# Patient Record
Sex: Female | Born: 1985 | Race: White | Hispanic: Yes | Marital: Married | Smoking: Current some day smoker
Health system: Southern US, Community
[De-identification: ages and names within clinical notes are randomized; demographics above are authoritative.]

## PROBLEM LIST (undated history)

## (undated) HISTORY — PX: CHOLECYSTECTOMY: SHX55

## (undated) HISTORY — PX: TUBAL LIGATION: SHX77

---
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
## 2005-10-09 ENCOUNTER — Emergency Department (HOSPITAL_COMMUNITY): Admission: EM | Admit: 2005-10-09 | Discharge: 2005-10-10 | Payer: Self-pay | Admitting: Emergency Medicine

## 2005-10-14 ENCOUNTER — Ambulatory Visit (HOSPITAL_COMMUNITY): Admission: RE | Admit: 2005-10-14 | Discharge: 2005-10-14 | Payer: Self-pay | Admitting: Obstetrics and Gynecology

## 2005-10-15 ENCOUNTER — Emergency Department (HOSPITAL_COMMUNITY): Admission: EM | Admit: 2005-10-15 | Discharge: 2005-10-15 | Payer: Self-pay | Admitting: Emergency Medicine

## 2005-10-20 ENCOUNTER — Encounter (INDEPENDENT_AMBULATORY_CARE_PROVIDER_SITE_OTHER): Payer: Self-pay | Admitting: General Surgery

## 2005-10-20 ENCOUNTER — Observation Stay (HOSPITAL_COMMUNITY): Admission: RE | Admit: 2005-10-20 | Discharge: 2005-10-22 | Payer: Self-pay | Admitting: General Surgery

## 2006-03-09 ENCOUNTER — Emergency Department (HOSPITAL_COMMUNITY): Admission: EM | Admit: 2006-03-09 | Discharge: 2006-03-09 | Payer: Self-pay | Admitting: Emergency Medicine

## 2007-07-05 IMAGING — US US TRANSVAGINAL NON-OB
1 series · 14 of 25 positions shown · non-contrast
Comparison: Obstetrical ultrasound 01/09/2004.

CLINICAL DATA: Prolonged menstrual period, lasting approximately 3 weeks.

TRANSVAGINAL PELVIC ULTRASOUND  10/14/2005:
TECHNIQUE: Transvaginal ultrasound examination of the pelvis was performed
including evaluation of the uterus, ovaries, adnexal regions, and pelvic
cul-de-sac.

[Series 1: unknown · 0.15mm/px · 14 of 37 slices shown]
[im 1/37]
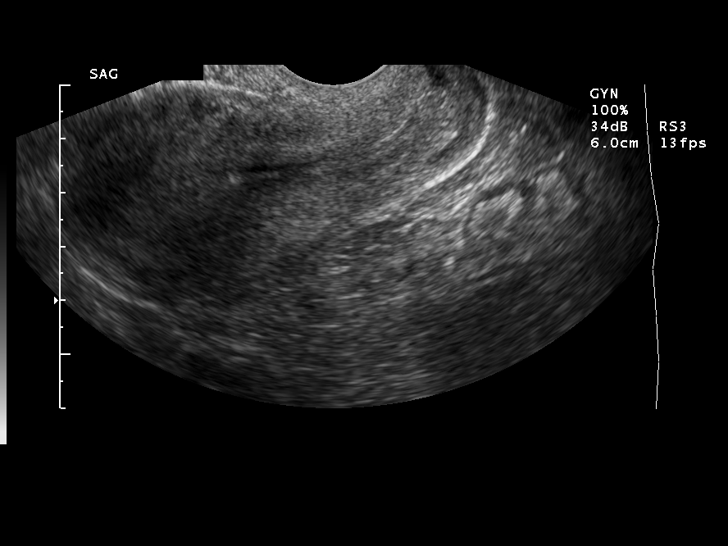
[im 4/37]
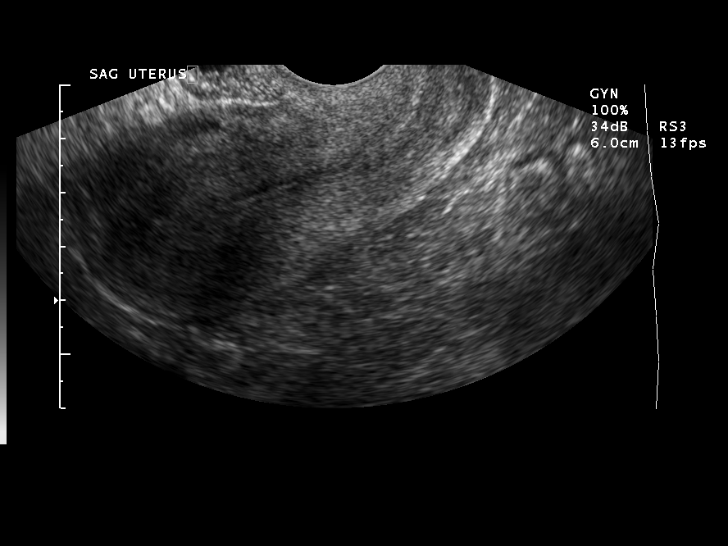
[im 7/37]
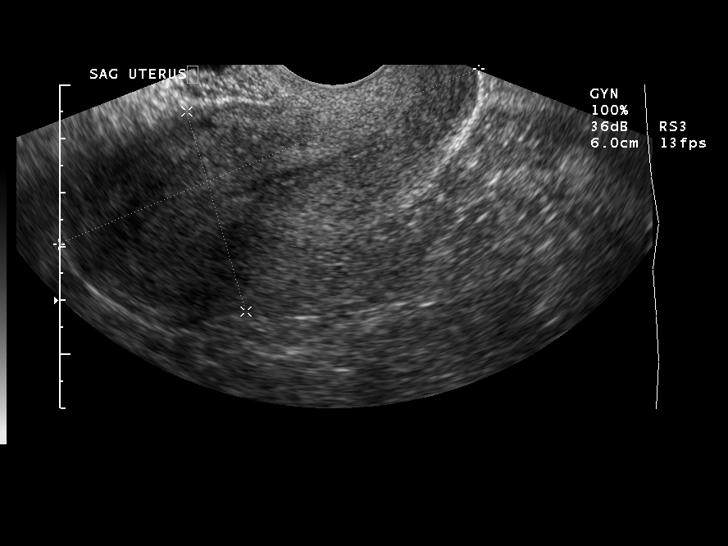
[im 10/37]
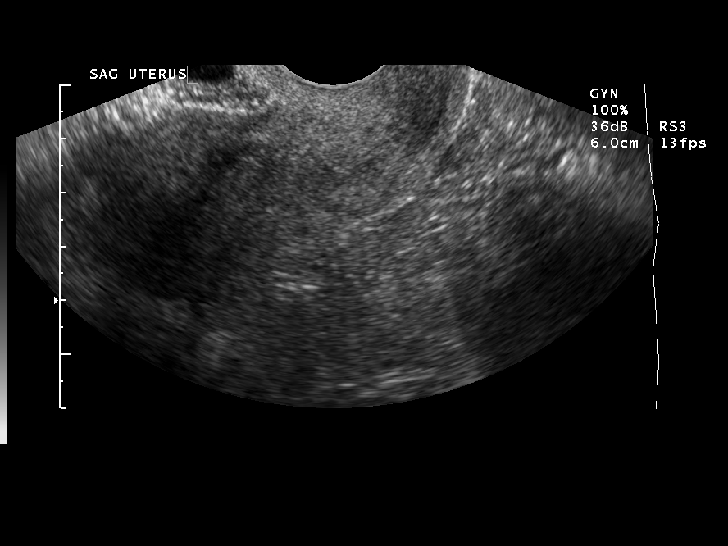
[im 13/37]
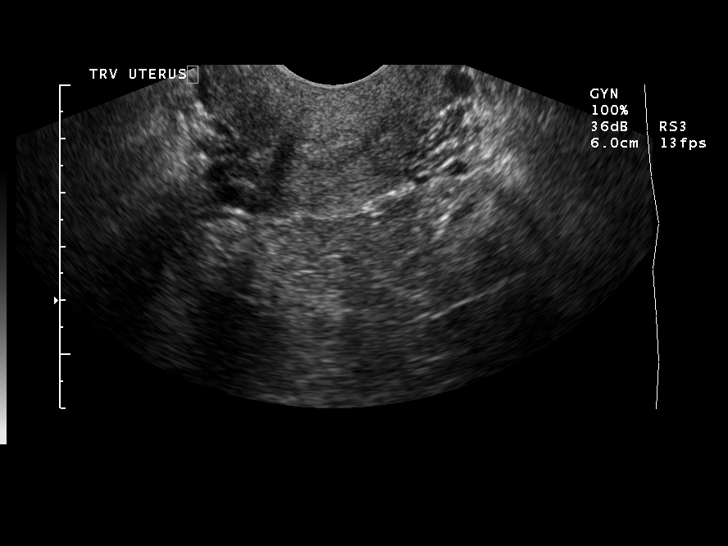
[im 14/37]
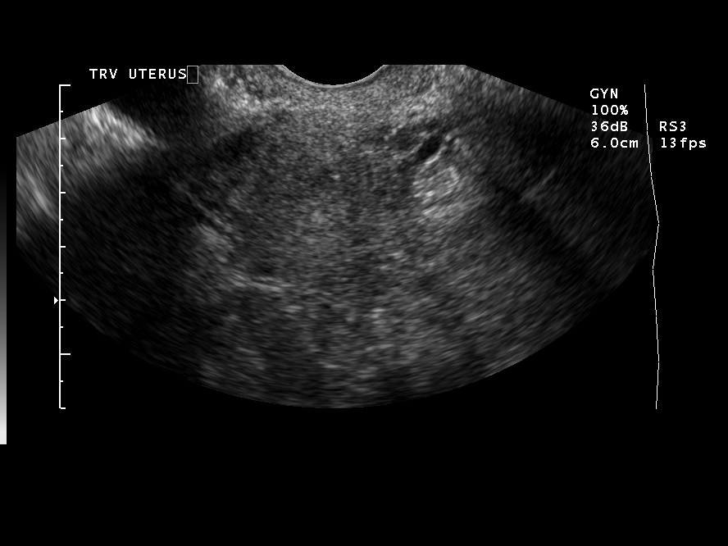
[im 17/37]
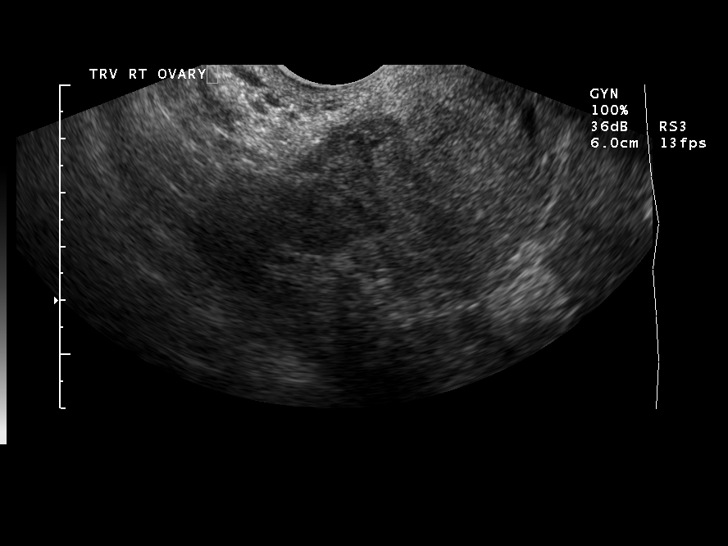
[im 20/37]
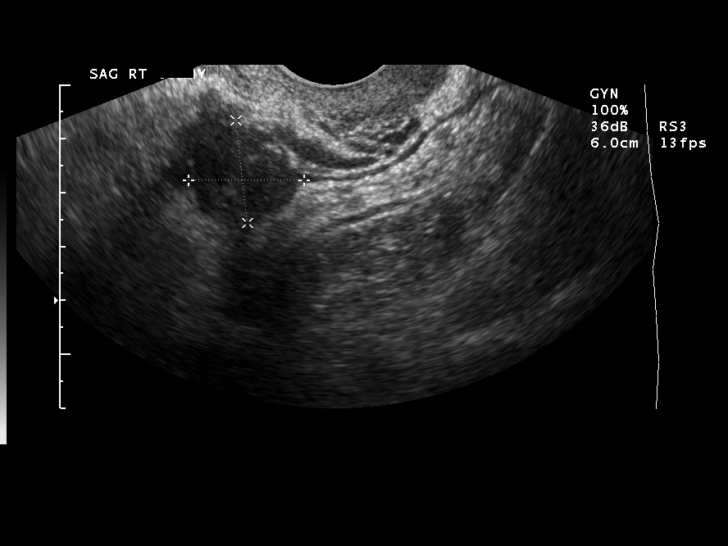
[im 23/37]
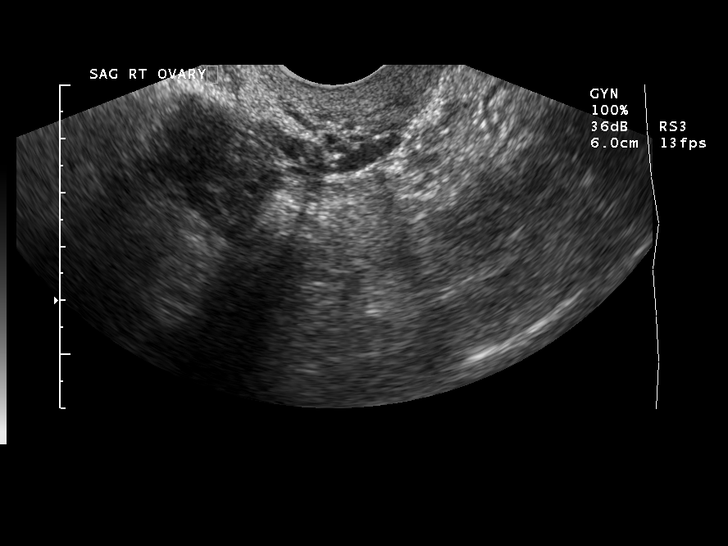
[im 25/37]
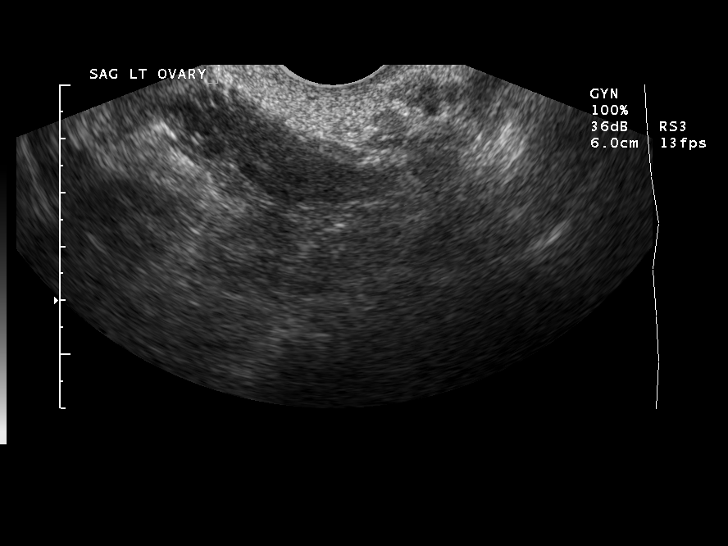
[im 28/37]
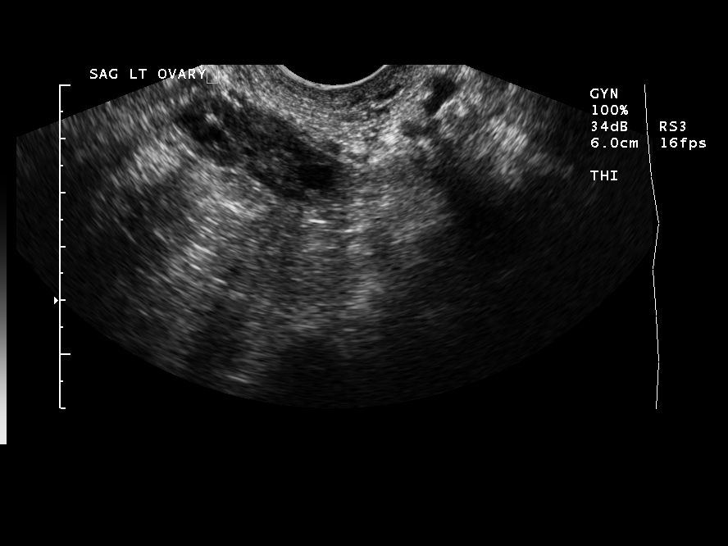
[im 31/37]
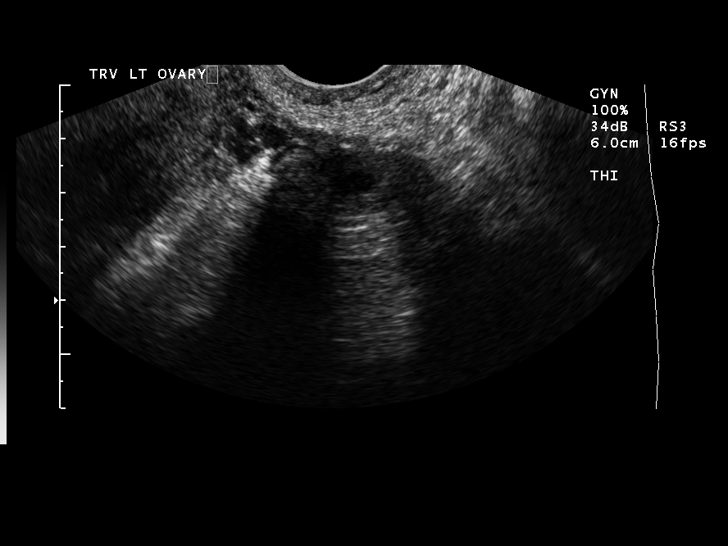
[im 34/37]
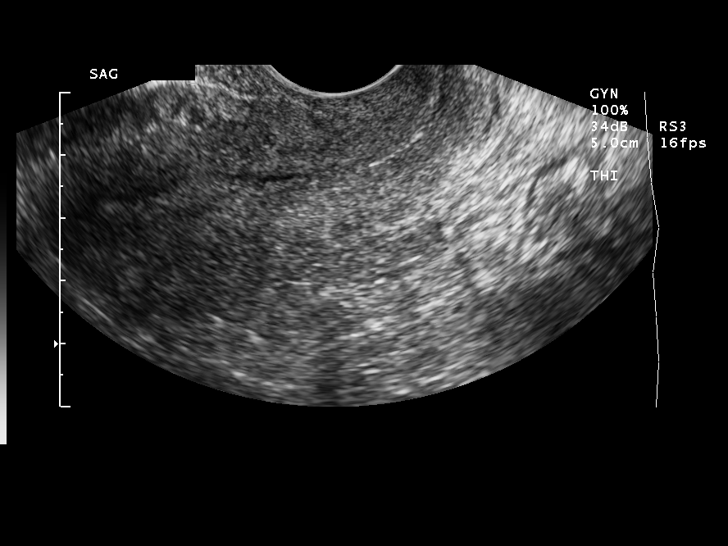
[im 37/37]
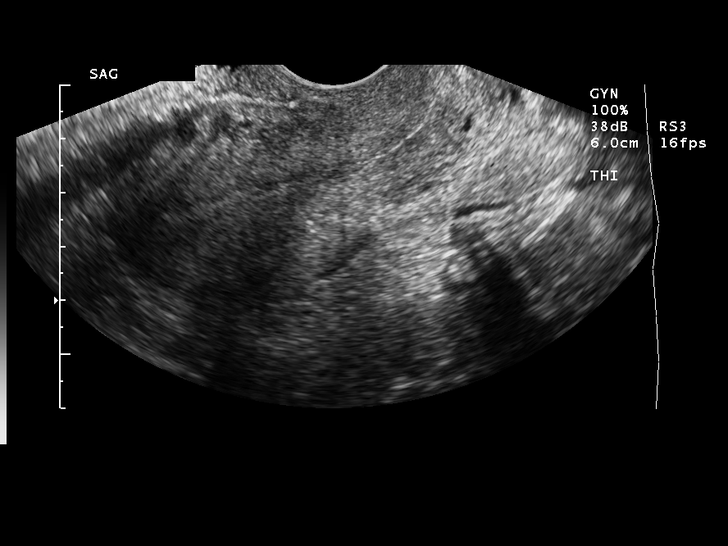

[14 of 25 positions shown; findings below may reference images not displayed]

FINDINGS: The uterus is normal in size measuring approximately 8.4 x 3.9 x
cm. The myometrium is heterogeneous, but no focal uterine fibroids are
identified. The endometrium is normal and trilayered in appearance and measures
approximately 6 mm in thickness.

Both ovaries are identified and both are normal in size and appearance. There
are small follicular cysts on both ovaries. The right ovary measures 2.6 x 1.9 x
2.1 cm. The left ovary measures 3.3 x 1.1 x 1.7 cm. No adnexal masses or free
fluid are identified.
IMPRESSION: 1. Normal appearing endometrium measuring 6 mm thickness.
2. Heterogeneous myometrium without focal uterine fibroid.
3. Normal appearing ovaries.

## 2007-09-30 ENCOUNTER — Inpatient Hospital Stay (HOSPITAL_COMMUNITY): Admission: AD | Admit: 2007-09-30 | Discharge: 2007-09-30 | Payer: Self-pay | Admitting: Obstetrics and Gynecology

## 2007-10-03 ENCOUNTER — Encounter (INDEPENDENT_AMBULATORY_CARE_PROVIDER_SITE_OTHER): Payer: Self-pay | Admitting: Obstetrics and Gynecology

## 2007-10-03 ENCOUNTER — Ambulatory Visit (HOSPITAL_COMMUNITY): Admission: AD | Admit: 2007-10-03 | Discharge: 2007-10-03 | Payer: Self-pay | Admitting: Obstetrics and Gynecology

## 2009-06-23 IMAGING — US US OB TRANSVAGINAL MODIFY
1 series · 14 of 28 positions shown · non-contrast
Comparison: none

CLINICAL DATA: 21-year-old female, positive pregnancy test with heavy bleeding and pelvic pain.  
OBSTETRICAL ULTRASOUND <14 WKS AND TRANSVAGINAL OB US:
TECHNIQUE: Both transabdominal and transvaginal ultrasound examinations were performed for complete evaluation of the gestation as well as the maternal uterus, adnexal regions, and pelvic cul-de-sac.

[Series 1: us ob comp less 14 wks · 43 acquisitions, 14 frames shown]
[im 2/43]
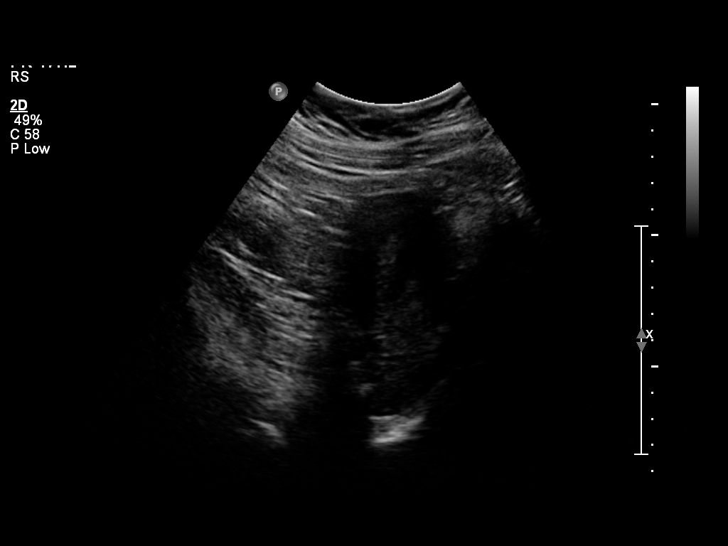
[im 5/43]
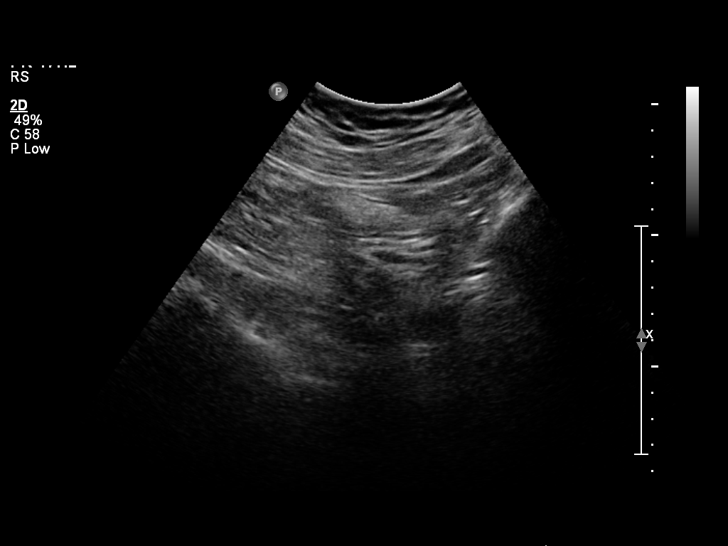
[im 8/43]
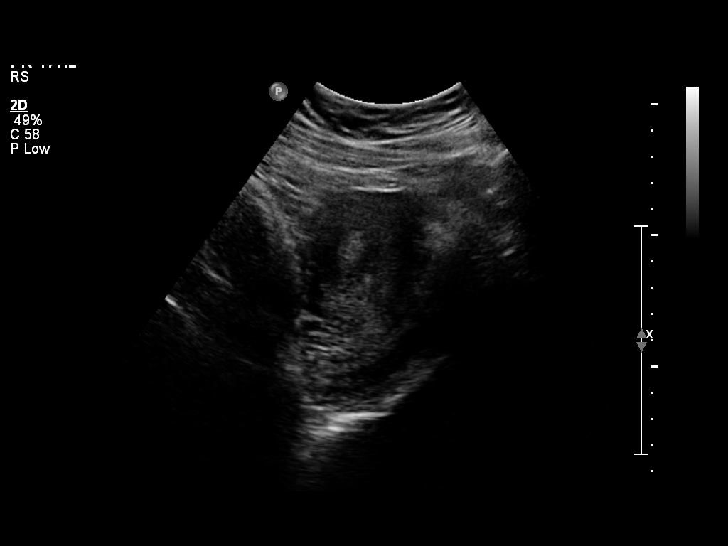
[im 11/43]
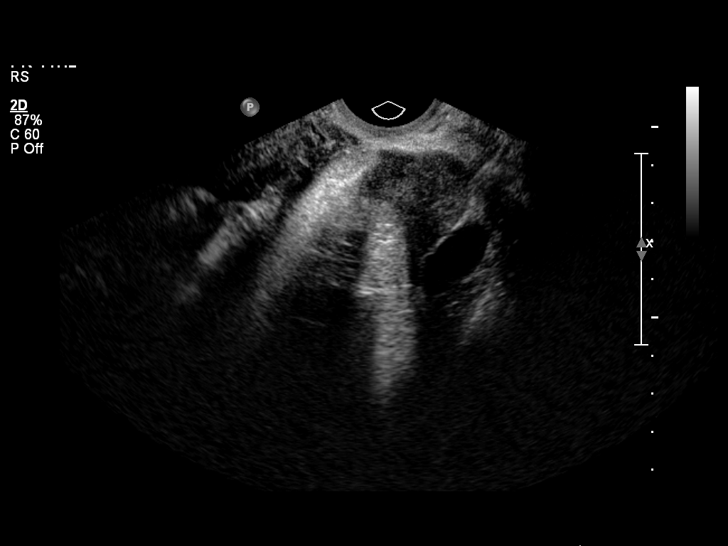
[im 15/43]
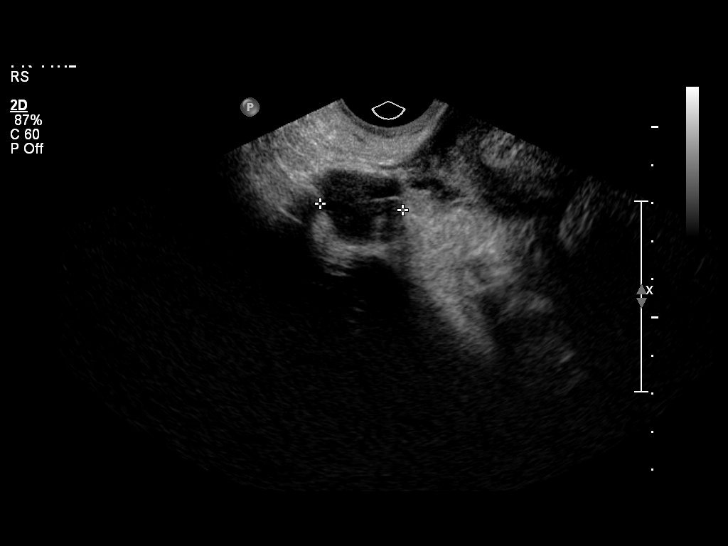
[im 18/43]
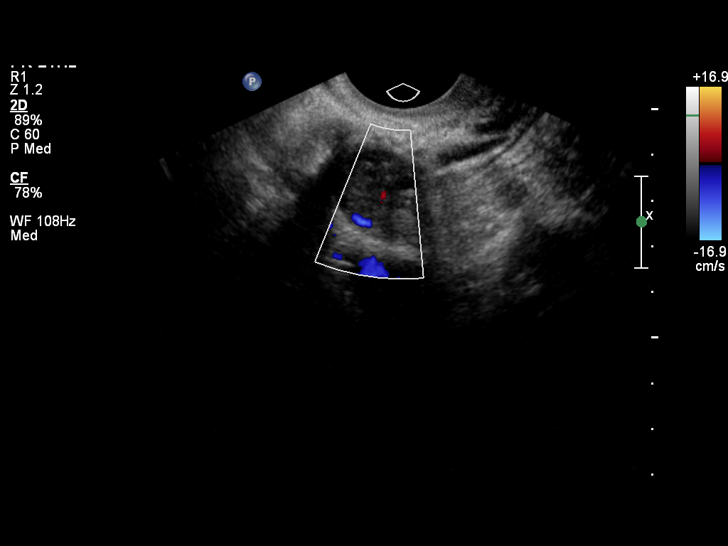
[im 21/43]
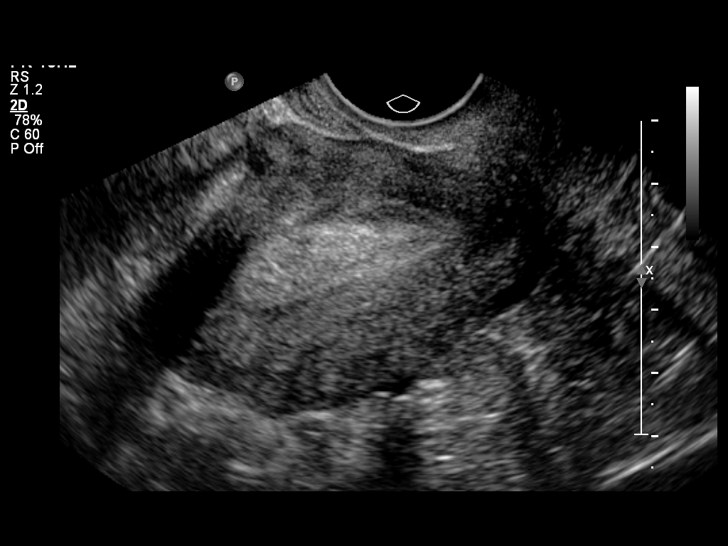
[im 24/43]
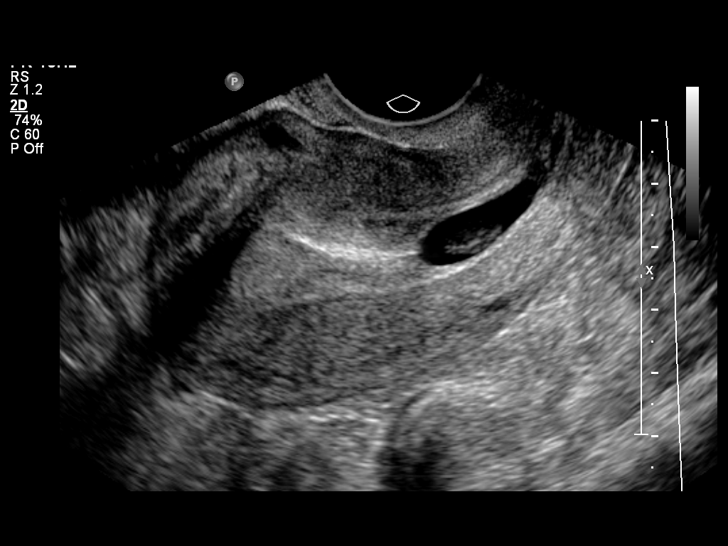
[im 27/43]
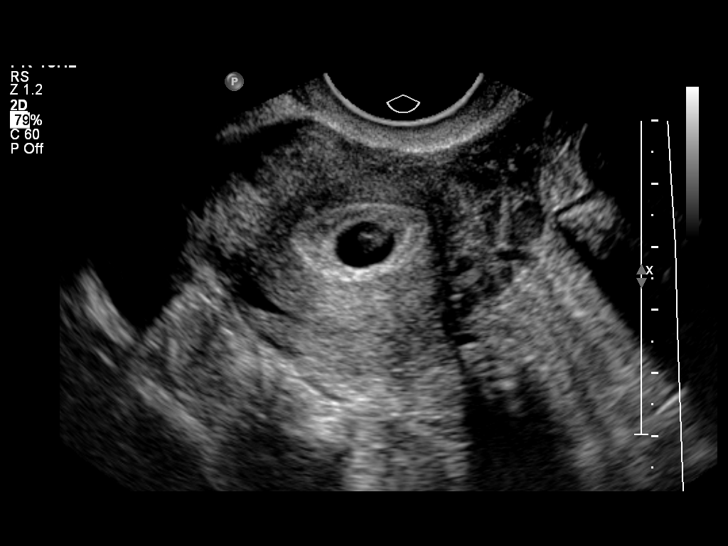
[im 30/43]
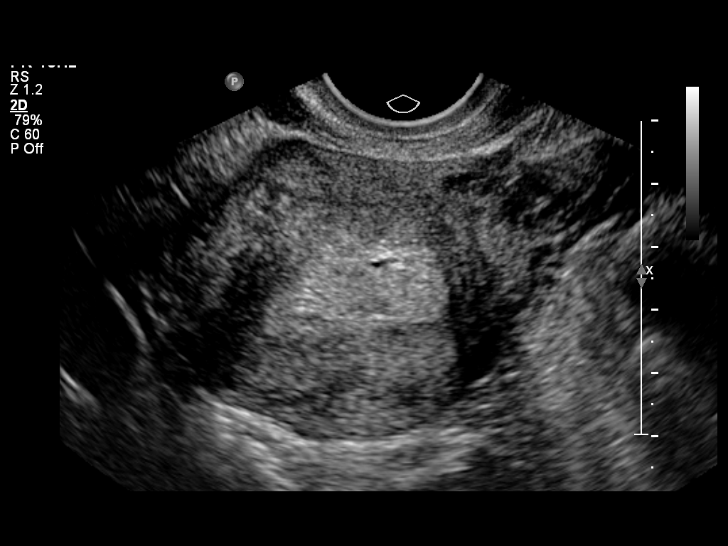
[im 33/43]
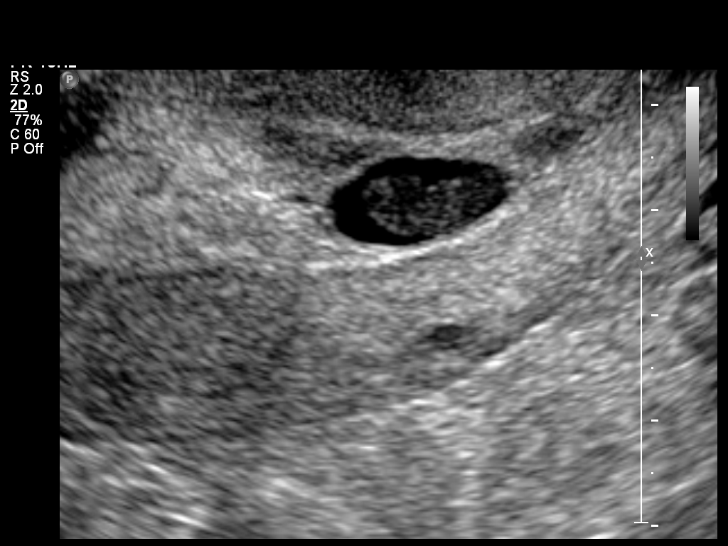
[im 36/43]
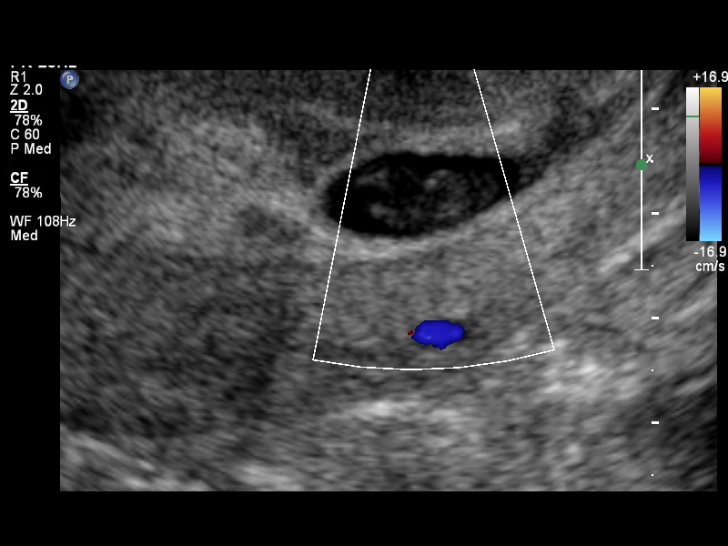
[im 39/43]
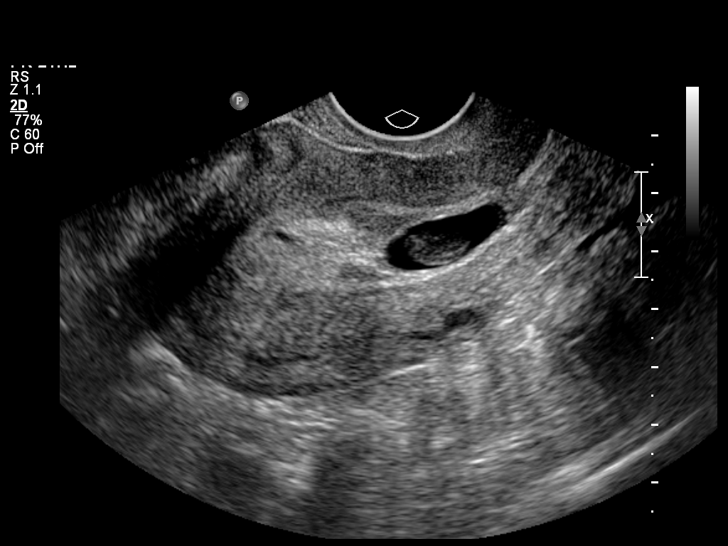
[im 43/43]
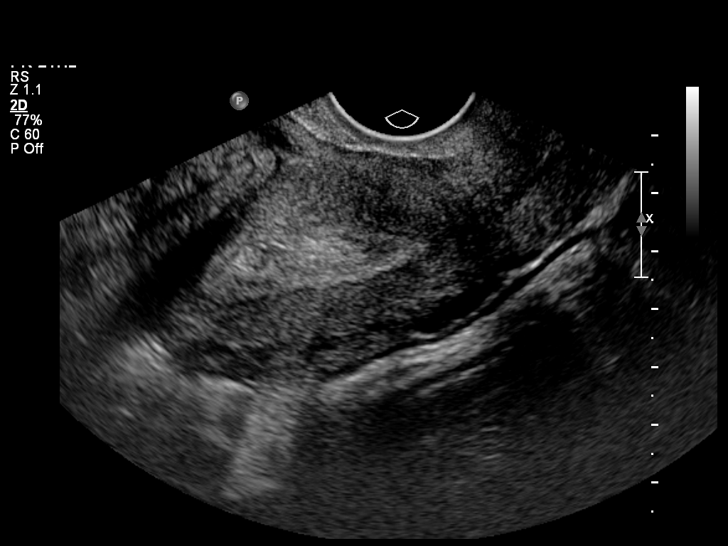

[14 of 28 positions shown; findings below may reference images not displayed]

FINDINGS: There is a single intrauterine gestational sac in the lower uterine segment without adjacent decidual reaction.  This sac contains a fetal pole with crown-rump length of 1.29 cm, corresponding to a gestational age of 7 weeks 4 days.  There is no cardiac activity identified in this embryo however.  The ovaries bilaterally are unremarkable.  There is no evidence of adnexal mass or free fluid.
IMPRESSION: Intrauterine gestational sac in the lower uterine segment containing an embyro (EGA 7w 4d) without fetal cardiac activity - compatible with failed first trimester pregnancy.

## 2011-04-21 NOTE — Op Note (Signed)
NAMELOURDES, KUCHARSKI NO.:  [id number]   MEDICAL RECORD NO.:  [id number]          PATIENT TYPE:  AMB   LOCATION:  [facility]                          FACILITY:  [facility]   PHYSICIAN:  Janine Limbo, M.D.DATE OF BIRTH:  05-12-1986   DATE OF PROCEDURE:  10/03/2007  DATE OF DISCHARGE:                               OPERATIVE REPORT   PREOPERATIVE DIAGNOSIS:  1. First trimester missed abortion.   POSTOPERATIVE DIAGNOSIS:  1. First trimester missed abortion.   PROCEDURE:  Suction dilatation and evacuation.   SURGEON:  Dr. Leonard Schwartz.   FIRST ASSISTANT:  None.   ANESTHETIC:  Monitored anesthetic control and paracervical block using  0.5% Marcaine with epinephrine.   DISPOSITION:  Mckenzie [name] is a 25 year old female, gravida 3, para 2-0-0-  2, who presents at 7-1/[redacted] weeks gestation.  An ultrasound was performed  that showed a nonviable gestation.  The patient presented complaining of  vaginal bleeding.  The patient received RhoGAM several days ago when she  was first evaluated for bleeding.  The patient understands the  indications for her surgical procedure and she accepts the risks of, but  not limited to, anesthetic complications, bleeding, infection, and  possible damage to surrounding organs.   FINDINGS:  A moderate amount of products of conception were removed from  within an eight weeks size uterus.  The patient's blood type is O  negative.  She received RhoGAM last week.   PROCEDURE:  The patient was taken to the operating room where she was  given medication through her IV line.  The patient's perineum, and  vagina were prepped with multiple layers of Betadine.  The bladder was  drained of urine.  Examination under anesthesia was performed.  The  patient was sterilely draped.  A paracervical block was placed using 10  mL of 0.5% Marcaine with epinephrine.  An additional 10 mL of 0.5%  Marcaine with epinephrine were injected into the cervix.  The  uterus  sounded to 8 cm.  Minimal dilatation was required.  The uterine cavity  was evacuated using a size 8 suction curette followed by medium sharp  curette.  The cavity was felt to be clean at the end of our procedure.  Hemostasis was confirmed.  All instruments were removed.  Examination  under anesthesia was repeated and the uterus was noted to be firm.  Sponge, needle, instrument counts were correct.  The estimated blood  loss was 10 mL.  The patient tolerated the procedure well and was taken  to the recovery room in stable condition.   FOLLOW-UP INSTRUCTIONS:  The patient will return to see Dr. Stefano Gaul in  2-3 weeks for follow-up examination.  She was given a copy of the  postoperative instruction sheet as prepared by the [facility]  [facility] for patients who have undergone a dilatation and evacuation.  She was given a prescription for ibuprofen and she will take 800 mg  every 8 hours as needed for mild to moderate pain.  She was also given a  prescription for Darvocet N 100 and she will take one tablet every 6  hours as  needed for severe pain.  She was given a copy of the  postoperative instruction sheet as prepared by the [facility]  [facility] for patients who have undergone a dilatation and curettage.      Janine Limbo, M.D.  Electronically Signed     AVS/MEDQ  D:  10/03/2007  T:  10/04/2007  Job:  [id number]

## 2011-04-24 NOTE — Consult Note (Signed)
NAME:  Mckenzie [name], Mckenzie [name] NO.:  [id number]   MEDICAL RECORD NO.:  [id number]                   PATIENT TYPE:  OBV   LOCATION:  A328                                 FACILITY:  [facility]   PHYSICIAN:  Barbaraann Barthel, M.D.              DATE OF BIRTH:  19-Jul-1986   DATE OF CONSULTATION:  DATE OF DISCHARGE:                                   CONSULTATION   REASON FOR CONSULTATION:  Surgery was asked to see this 25 year old [place]  female who was admitted to the emergency room with abdominal discomfort.   CHIEF COMPLAINT:  Epigastric discomfort and dysuria and nausea for  approximately three days.   HISTORY OF PRESENT MEDICAL ILLNESS:  The patient states that three days ago  she had some pain after eating a torta - a [place] taco-type of dinner,  cake, and she had some nausea, and she also had some dysuria as well.  She  states that approximately a month ago she was treated in the clinic with a  penicillin-type of drug for a urinary tract infection and she still states  that she has some dysuria.  She did not have any vomiting; however, she did  have nausea and as the pain increased in her epigastrium she came to the  emergency room where she was admitted in the wee hours of May 10, 2003.  Surgery was consulted.   PHYSICAL EXAMINATION:  GENERAL:  Discloses a pleasant 25 year old [place]  female, uncomfortable but in no acute distress.  VITAL SIGNS:  Temperature 98.2, heart rate 66 per minute, blood pressure  107/53.  Her weight is 201 and she is 65 inches tall.  HEENT:  Head is normocephalic.  Eyes show extraocular movements are intact.  Pupils are round and react to light and accomodation.  There is no  conjunctival pallor or scleral injection.  The sclerae has a normal  tincture.  Nose and oral mucosa are moist.  NECK:  Supple and cylindrical without jugular vein distention, thyromegaly,  tracheal deviation, or adenopathy.  No bruits are auscultated and  there is  no nuchal rigidity.  CHEST:  Clear both to anterior and posterior auscultation.  HEART:  Regular rhythm.  No murmurs are appreciated.  BREAST:  Breasts and axilla are without masses.  ABDOMEN:  Soft.  The patient is softly tender in the epigastrium.  Bowel  sounds are present.  No femoral or inguinal hernias are appreciated.  RECTAL:  Guaiac negative stool.  EXTREMITIES:  Within normal limits.  BACK:  No CV tenderness elicited.   REVIEW OF SYSTEMS:  OB/GYN:  The patient is married.  She has a six-month-  old child.  She is a gravida 1 para 1 aborta 0 cesarean 0 female.  No family  history of breast carcinoma.  Her last menstrual period was early in May;  she is due currently.  GU:  History of recurrent urinary tract infections.  She has no  history of nephrolithiasis.  ENDOCRINE:  No history of diabetes  or thyroid disease.  CARDIOVASCULAR:  The patient has no history of  hypertension and she is a nonsmoker.  MUSCULOSKELETAL:  The patient is obese  but otherwise a well-nourished patient.  SOCIOECONOMIC:  The patient has  lived in the [place] and speaks English very well.  We discussed her medical  condition both in [place] and [place] and all questions were answered.   LABORATORY DATA:  The patient has a white count of 12.2 with an H&H of 12.1  and 34.7 with 72 neutrophils appreciated.  Amylase within normal limits;  lipase is also within normal limits.  Urinalysis is grossly within normal  limits.  She has rare bacteria, 7-10 white blood cells noted.  Metabolic 7  with a potassium of 3.2.  The rest of her liver function studies are not on  the chart at the time of this dictation.  They were reported as normal; we  will obtain those.   IMPRESSION:  Right upper quadrant pain and a history of urinary tract  infections.  We will check a sonogram of her abdomen to rule out gallbladder  disease; also obtain a sonogram of the abdomen and pelvis to make sure that  there are no other  abnormalities as she has a somewhat difficult abdomen to  assess due to her size.  All this was explained in [place] and [place] and  all questions were answered.                                               Barbaraann Barthel, M.D.    WB/MEDQ  D:  05/10/2003  T:  05/10/2003  Job:  [id number]

## 2011-04-24 NOTE — Group Therapy Note (Signed)
   NAMEIVEE, POELLNITZ NO.:  [id number]   MEDICAL RECORD NO.:  [id number]                   PATIENT TYPE:  OUT   LOCATION:  RAD                                  FACILITY:  [facility]   PHYSICIAN:  Roylene Reason. Lisette Grinder, M.D.             DATE OF BIRTH:  10/10/1986   DATE OF PROCEDURE:  DATE OF DISCHARGE:  06/06/2002                                   PROGRESS NOTE   HISTORY:  The patient is a 25 year old, gravida 1, para 0, currently at [redacted]  weeks gestation.  Her EDC is based upon a six week crown rump length and  this has likewise since been confirmed with a 19-week ultrasound done at  [facility] at which time there was noted to be a normal anatomic  survey.  The patient has had lab work done.  Her AFP peripheral screen is  abnormal placing her at a 1:100 risk of Down's syndrome, whereas based on  her age alone she would be a less than 1:1667 risk of Down's syndrome.  Discussed this with the patient and her husband today.  The patient herself  prefers literature in [place].  Thus a copy of the ACOG brochure on AFP  peripheral screen was given to the patient and her husband.  She was advised  that this represents a 1% risk of Down's syndrome in the infant which  likewise translates into 99% risk that the baby is not affected with Down's  syndrome.  I did discuss with the patient and her husband genetic  amniocentesis for additional work-up if the 1:100 risk is very concerning to  them.  All questions were answered.  They were given a brochure to review  amongst themselves.  Will return in four weeks' time.  Otherwise she is  encouraged to call the office with any questions or if there is a desire to  proceed with the amniocentesis.                                               Donald P. Lisette Grinder, M.D.    DPC/MEDQ  D:  07/03/2002  T:  07/06/2002  Job:  [id number]

## 2011-04-24 NOTE — Op Note (Signed)
NAMEPIER, LAUX NO.:  [id number]   MEDICAL RECORD NO.:  [id number]                   PATIENT TYPE:  INP   LOCATION:  A426                                 FACILITY:  [facility]   PHYSICIAN:  Langley Gauss, M.D.                DATE OF BIRTH:  27-Dec-1985   DATE OF PROCEDURE:  10/28/2002  DATE OF DISCHARGE:                                 OPERATIVE REPORT   PREOPERATIVE DIAGNOSES:  A 39 week intrauterine pregnancy for induction.   POSTOPERATIVE DIAGNOSES:  A 39 week intrauterine pregnancy for induction.   DELIVERY PERFORMED:  1. Spontaneous cystovaginal delivery of 6 pound, 8 ounce female infant.  2. Midline episiotomy repair.   SURGEON:  Langley Gauss, M.D.   ESTIMATED BLOOD LOSS:  Less than 500 cc.   ANALGESIA:  The patient received IV pain medication only during the course  of labor. She did receive 30 cc of 1% lidocaine in the midline and perineal  body at the time of delivery.   SPECIMENS:  Arterial cord gas and cord blood are obtained from the umbilica  cord. The placenta is examined and noted to be apparently intact with a  three vessel umbilical cord.   COMPLICATIONS:  The patient did achieve a Tmax of 99.8 during the second  stage of labor. She was treated at that time with 2 gm of IV ampicillin just  prior to delivery.   SUMMARY:  The patient admitted for induction of labor on October 27, 2002.  Amniotomy performed with the findings of cervix to be 3 cm dilated, clear  amniotic fluid is noted, a fetal scalp electrode is placed, reassuring fetal  heart rate was documented throughout the course of labor. The patient  requested only IV pain medication only after achieving a functional active  labor pattern with Pitocin. The patient progressed normally along the labor  curve to complete dilatation. After completion of dilatation, the patient  pushed very discoordinately; however, due to the excellent assistance of the  nursing staff,  she was able to thereafter push effectively with descent of  the vertex to the perineal floor. At that time, she was placed in the dorsal  lithotomy position, prepped and draped in the usual sterile manner. 30 cc of  1% lidocaine injected in the midline and perineal body. With distention of  the perineum, a small midline episiotomy was performed, infant was noted to  deliver in a direct OA position over this midline episiotomy without  extension. Mouth and nares were both suctioned of clear amniotic fluid.  Renewed expulsive efforts resulted in a spontaneous rotation to a left  anterior shoulder position. Gentle abdominal traction combined with  expulsive efforts resulted in delivery of the remainder of the infant  without difficulty. The umbilical cord is then milked towards the infant and  cord was doubly clamped and cut and spontaneous and vigorous breathing cries  noted. The infant was placed on  the maternal abdomen for immediate bonding  purposes. Arterial cord gas and cord blood was then obtained. Gentle  traction of the umbilical cord results in separation which upon examination  appears to be an intact three vessel placenta and cord. Excellent uterine  tone was achieved. Examination of the genital tract reveals no laceration.  The midline episiotomy is not extended. This was easily repaired utilizing  #0 Chromic in a running locked fascia on the vaginal mucosa followed by 2  layer closure of #0 Chromic on the perineal body. The patient tolerated  delivery very well. She was taken out of dorsal lithotomy position and  allowed to bond with the infant following delivery. The patient does plan on  both bottle and breast feeding.                                               Langley Gauss, M.D.    DC/MEDQ  D:  10/30/2002  T:  10/30/2002  Job:  [id number]

## 2011-04-24 NOTE — Discharge Summary (Signed)
   NAMEJIM, Mckenzie [name] NO.:  [id number]   MEDICAL RECORD NO.:  [id number]                   PATIENT TYPE:  INP   LOCATION:  A426                                 FACILITY:  [facility]   PHYSICIAN:  Langley Gauss, M.D.                DATE OF BIRTH:  1986-06-10   DATE OF ADMISSION:  10/27/2002  DATE OF DISCHARGE:  10/30/2002                                 DISCHARGE SUMMARY   DIAGNOSES:  Thirty-nine-week intrauterine pregnancy delivered.   DISPOSITION:  At time of discharge the patient is both bottle- and breast-  feeding.  She would like to follow up in the office in 4 weeks' time for  initiation of birth control pills.  She is given a copy of the standard  discharge instruction at time of discharge.  Pertinently 6-pound 8-ounce  female infant.  The family did not desire infant circumcision.  At the time of  discharge the patient is given a prescription for Tylenol No. 3 for pain  relief.  In addition, she has not had a bowel movement since delivery.  She  is requesting relief of this.  She is given a prescription for Dulcolax  suppositories.   PEDIATRICIAN:  Pediatrician on call.   PERTINENT LABORATORY STUDIES:  Admission hemoglobin and hematocrit 10.9/31.6  with a white count of 10.9.  On postpartum day #1 hemoglobin 11.0,  hematocrit 32.2, with a white count of 22.5.  She is O negative blood type.  She did receive RhoGAM prior to discharge.   HOSPITAL COURSE:  See previous dictations.  The patient did well postpartum.  She had received the single dose of IV ampicillin during the course of labor  just prior to delivery.  Postpartum the patient did well, she bonded well  with the infant, she both bottle- and breast-fed, she ambulated without  difficulty, had minimal discomfort in the episiotomy which was easily  remedied with p.o. Tylenol No. 3.  The patient was thus discharged home on  postpartum day #2-1/2 as stated previously both bottle- and  breast-feeding  with the infant noncircumcised per their request.                                               Langley Gauss, M.D.    DC/MEDQ  D:  10/30/2002  T:  10/30/2002  Job:  [id number]

## 2011-04-24 NOTE — Discharge Summary (Signed)
NAME:  Mckenzie [name], Mckenzie [name]                   ACCOUNT NO.:  [id number]   MEDICAL RECORD NO.:  [id number]                   PATIENT TYPE:  INP   LOCATION:  A426                                 FACILITY:  [facility]   PHYSICIAN:  Langley Gauss, M.D.                DATE OF BIRTH:  22-Mar-1986   DATE OF ADMISSION:  05/13/2004  DATE OF DISCHARGE:  05/15/2004                                 DISCHARGE SUMMARY   DIAGNOSES:  An 25 year old gravida 2, para 1, [redacted] weeks gestation admitted  for induction of labor secondary to psychosocial factors.  The patient  underwent indicated induction of labor.  She was noted to have a favorable  cervix with the initial examination 3 cm dilated.  Amniotomy was performed.  Clear amniotic fluid was noted.  She did require Pitocin induction.  The  patient received only IV Nubain during the course of labor.  She was offered  epidural, but she labored so quickly that we would have been unable to place  this prior to her delivery.  At the time of delivery, there was some  postpartum hemorrhage encountered secondary to uterine atony with an  estimated blood loss of 600 cc.  This was managed primarily with bimanual  massage of the uterus as well as use of IV Pitocin solution.  Postpartum the  patient did well.  She bonded well with the infant.  She had no postpartum  complications.  She was discharged home on May 15, 2004.   PERTINENT LABORATORY DATA:  The patient is O negative blood type.  RhoGAM  workup was initiated.  The patient did receive full dose RhoGAM prior to  discharge.  RPR was nonreactive.  Initial hemoglobin and hematocrit 9.7/29.3  with white count of 8.6.  Final hemoglobin 9.0 with hematocrit of 26.4,  white count 14.0.  Both these studies are indicative of iron deficiency  anemia as well as anemia secondary to blood loss.   DISCHARGE INSTRUCTIONS:  The patient is given a copy of standard discharge  instructions at the time of discharge, and should be  following up in the  office in 4-6 weeks time for initiation of birth control use.     ___________________________________________                                         Langley Gauss, M.D.   DC/MEDQ  D:  06/12/2004  T:  06/12/2004  Job:  [id number]

## 2011-04-24 NOTE — H&P (Signed)
   NAME:  Mckenzie [name], Mckenzie [name] NO.:  [id number]   MEDICAL RECORD NO.:  [id number]                   PATIENT TYPE:  INP   LOCATION:  A426                                 [facility]ACILITY:  [facility]   PHYSICIAN:  Langley Gauss, M.D.                DATE OF BIRTH:  1986/10/18   DATE OF ADMISSION:  10/27/2002  DATE OF DISCHARGE:                                HISTORY & PHYSICAL   This is a 25 year old gravida 1, para 0, [redacted] weeks gestation who is admitted  for induction of labor due to psychosocial factors.  The patient's prenatal  course has been complicated by:  1) The patient being Rh negative.  She did  receive RhoGAM on August 15, 2002.  2)  The patient has an abnormal AFP  triple screen placing her at 1:100 risk of Down syndrome.  She was counseled  regarding this during the pregnancy and she declined amniocentesis.   PAST MEDICAL HISTORY:  Negative.   PAST SURGICAL HISTORY:  Negative.   ALLERGIES:  She has no known drug allergies.   CURRENT MEDICATIONS:  Prenatal vitamins.   PHYSICAL EXAMINATION:  GENERAL:  Healthy Latino female who speaks reasonable  [place].  VITAL SIGNS:  Height 5 feet 3 inches, weight 196 pounds, blood pressure  140/85, pulse 110, respiratory rate 24.  HEENT:  Negative.  No adenopathy.  NECK:  Supple.  Thyroid is not palpable.  LUNGS:  Clear.  CARDIOVASCULAR:  Regular rate and rhythm.  ABDOMEN:  Soft, nontender.  No surgical scars were identified.  She has  vertex presentation by Leopold's maneuver.  Fundal height is 37 cm.  EXTREMITIES:  She has trace edema.  PELVIC:  Normal external genitalia.  No lesions or ulcerations identified.  No vaginal bleeding or leakage of fluids.  Cervix 3 cm dilated, 70% effaced,  minus 1 station, vertex presentation.  External fetal monitor reveals a  reassuring fetal heart rate.  No uterine contractions are identified.  Estimated fetal weight is 7 pounds.  Pelvis is noted to be clinically  adequate.   ASSESSMENT:  A 39+ weeks intrauterine pregnancy, variable cervix.  We will  proceed with amniotomy, thereafter induce with Pitocin as clinically  indicated.                                               Langley Gauss, M.D.    DC/MEDQ  D:  10/30/2002  T:  10/30/2002  Job:  [id number]

## 2011-04-24 NOTE — Op Note (Signed)
[name], [name] NO.:  [id number]   MEDICAL RECORD NO.:  [id number]          PATIENT TYPE:  INP   LOCATION:  A312                          FACILITY:  [facility]   PHYSICIAN:  Dirk Dress. Katrinka Blazing, M.D.   DATE OF BIRTH:  August 25, 1986   DATE OF PROCEDURE:  10/20/2005  DATE OF DISCHARGE:                                 OPERATIVE REPORT   PREOPERATIVE DIAGNOSIS:  Cholelithiasis, cholecystitis.   POSTOPERATIVE DIAGNOSIS:  Cholelithiasis, cholecystitis.   PROCEDURE:  Laparoscopic cholecystectomy.   SURGEON:  Dr. Katrinka Blazing.   DESCRIPTION:  Under general endotracheal anesthesia, the patient's abdomen  was prepped and draped in a sterile field. Supraumbilical incision was made.  Veress needle was inserted uneventfully. Abdomen was insufflated with 3  liters of CO2. Using a Visiport guide, a 10-mm port was placed. Laparoscope  was placed. The patient was placed in the reversed Trendelenburg position.  Under videoscopic guidance, a 10-mm port and two 5-mm ports were placed in  the right subcostal region. The gallbladder was grasped and positioned.  Cystic duct was dissected, clipped with five clips and divided closed to the  gallbladder. The cystic artery had two branches. Each was dissected, clipped  with three clips and divided close to the gallbladder. Gallbladder was then  placed on tension and stretched. Using electrocautery, it was separated from  the intrahepatic bed without difficulty. It was separated intact. The  gallbladder was placed in an EndoCatch device and retrieved along with the  stones. Hemostasis in the bed was noted to be adequate. There was no  bleeding. There was no evidence of bile leak. The abdomen was irrigated;  fluid returned clear. CO2 was allowed to escape from the abdomen, and the  ports were removed. Supraumbilical incision was closed with 0 Vicryl on the  fascia. All incisions were closed with staples on the skin. Dressings were  placed. The patient  was awakened from anesthesia uneventfully, transferred  to a bed, and taken to the post anesthetic care unit in satisfactory  condition.      Dirk Dress. Katrinka Blazing, M.D.  Electronically Signed     LCS/MEDQ  D:  10/20/2005  T:  10/20/2005  Job:  [id number]

## 2011-04-24 NOTE — H&P (Signed)
NAME:  Mckenzie [name], Mckenzie [name]                   ACCOUNT NO.:  [id number]   MEDICAL RECORD NO.:  [id number]                   PATIENT TYPE:  INP   LOCATION:  LDR2                                 FACILITY:  [facility]   PHYSICIAN:  Langley Gauss, M.D.                DATE OF BIRTH:  09-13-1986   DATE OF ADMISSION:  05/13/2004  DATE OF DISCHARGE:                                HISTORY & PHYSICAL   HISTORY OF PRESENT ILLNESS:  This is an 25 year old gravida 2, para 1 at [redacted]  weeks gestation who is admitted for induction of labor for psychosocial  factors.  Father of the baby, Mckenzie [name], would like to be present there  at time of labor and delivery.  She is, in addition, noted to have a very  favorable cervix for induction of labor and a history of short second stage.  Patient's prenatal course is noted to be uncomplicated.  She did have a  glucose tolerance test done August 2003 which was normal at 93.  GBS carrier  status negative Apr 23, 2004.  The patient presented for first prenatal  visit at 20+ weeks gestation.  Thus, it was too late to proceed with  maternal AFP screening.  She is noted to be RPR nonreactive.  GC and  Chlamydia are negative.  She has had two ultrasounds which were documented  and normal at time of survey and adequate fetal growth.   ALLERGIES:  No known drug allergies.   CURRENT MEDICATIONS:  Prenatal vitamins.   PHYSICAL EXAMINATION:  VITAL SIGNS:  Weight 204 pounds, 118/61, pulse 80,  respirations 20.  HEENT:  Negative.  No adenopathy.  NECK:  Supple.  Thyroid nonpalpable.  LUNGS:  Clear.  CARDIOVASCULAR:  Regular rate and rhythm.  ABDOMEN:  Soft and nontender.  No surgical scars were identified.  She has  vertex presentation by Leopold's maneuvers.  EXTREMITIES:  Normal.  PELVIC:  Normal external genitalia.  No lesions or ulcerations identified.  Pelvis is noted to be clinically adequate.  Cervix is 3 cm dilated, 80%  effaced.  Vertex at a 0 station and well  engaged.  External fetal monitor  reveals minimal uterine activity.   ASSESSMENT:  A 39 week intrauterine pregnancy, favorable cervix, history of  rapid labor and delivery.  With previous labor and deliver, the patient  received only IV pain medications.   PLAN:  Proceed with amniotomy.  Thereafter, will augment her induced with  Pitocin as clinically indicated.  She does plan on both bottle and breast  feeding.  She would like to use an IUD for postpartum birth control  purposes.     ___________________________________________                                         Langley Gauss, M.D.   DC/MEDQ  D:  05/13/2004  T:  05/13/2004  Job:  [id number]

## 2011-04-24 NOTE — Op Note (Signed)
NAME:  Mckenzie [name], Mckenzie [name]                   ACCOUNT NO.:  [id number]   MEDICAL RECORD NO.:  [id number]                   PATIENT TYPE:  INP   LOCATION:  A426                                 FACILITY:  [facility]   PHYSICIAN:  Langley Gauss, M.D.                DATE OF BIRTH:  [birth year]   DATE OF PROCEDURE:  05/13/2004  DATE OF DISCHARGE:                                 OPERATIVE REPORT   DELIVERY NOTE:  A 38-39 week intrauterine pregnancy.  History of rapid labor  and delivery.   DELIVERY FORM:  1. Spontaneous assisted vaginal delivery, 7 pound, 5 ounce female infant over     intact perineum.  Delivery performed by Dr. Roylene Reason. Lisette Grinder.   ESTIMATED BLOOD LOSS:  Less than 500 mL.   COMPLICATIONS:  None.   SPECIMENS:  Arterial cord gas and cord blood for laboratory.  Placenta was  examined and noted to be apparently intact with a three-vessel umbilical  cord.  No lacerations on it at time of delivery.   ANALGESIA:  The patient received IV Nubain and Phenergan during the course  of labor.  She was offered epidural but as with her previous labor and  delivery, she had declined placement of epidural.   SUMMARY:  The patient admitted for induction of labor due to history of  rapid labor and delivery.  On initial examination a.m. of May 13, 2004, she  is 3+ cm dilated, 80% effaced, 0 station, vertex presentation confirmed,  amniotomy is performed with findings of clear amniotic fluid.  The patient  subsequently did not enter active labor.  She required Pitocin induction.  With initiation of Pitocin, she progressed very slowly into active labor;  however, with reaching the active phase of labor at 4-5 cm dilatation, she  progressed very rapidly to complete dilatation.  She was placed in the  dorsal lithotomy position, pushed well during short second stage of labor to  deliver in a direct OA position over an intact perineum.  Mouth and ears  bulb suctioned of clear amniotic fluid.   Umbilical cord moved towards the  infant.  Cord is doubly clamped and cut.  Infant is taken to the nursery  table for immediate assessment.  Arterial cord gas and cord blood then  obtained.  Gentle traction on the umbilical cord results in separation which  upon examination is intact placenta with associated with three-vessel  umbilical cord.  Examination of genital tract revealed intact perineum.  No  lacerations noted to occur.  Following delivery, had to perform bimanual  compression of the uterus to achieve adequate uterine tone and minimize  bleeding.  As an aside, patient did have dilute Pitocin solution  administered at a rate of only 999 mL/hr.  Thus, it was a bimanual massage  of the uterus which controlled the bleeding following delivery.  The patient  does plan on both bottle and breast feeding.      ___________________________________________  Langley Gauss, M.D.   DC/MEDQ  D:  05/13/2004  T:  05/14/2004  Job:  [id number]

## 2011-04-24 NOTE — H&P (Signed)
Mckenzie [name], HASHMAN         ACCOUNT NO.:  [id number]   MEDICAL RECORD NO.:  [id number]          PATIENT TYPE:  AMB   LOCATION:  DAY                           FACILITY:  [facility]   PHYSICIAN:  Jerolyn Shin C. Katrinka Blazing, M.D.   DATE OF BIRTH:  [birth year]   DATE OF ADMISSION:  DATE OF DISCHARGE:  LH                                HISTORY & PHYSICAL   This is a 25 year old female with a one-week history of pain in her upper  abdomen and right upper quadrant with nausea but no vomiting.  She was  previously having diarrhea but is now constipated.  The patient had a severe  attack and was seen in the emergency room.  On evaluation, she was having  right upper quadrant pain and demonstrated tenderness.  Gallbladder  ultrasound revealed multiple gallstones.  The patient is asymptomatic and is  scheduled for cholecystectomy.   PAST MEDICAL HISTORY:  She is relatively healthy.  She has no medical  illnesses.  She is moderately obese. She has not had previous surgery.   ALLERGIES:  She has no allergies.   PHYSICAL EXAMINATION:  VITAL SIGNS:  Blood pressure 128/76, pulse 70,  respirations 20.  Weight 229 pounds.  HEENT:  Unremarkable.  NECK:  Supple.  No JVD, bruit, adenopathy, or thyromegaly.  CHEST: Clear to auscultation.  HEART:  Regular rate and rhythm without murmur, gallop, or rub.  ABDOMEN:  Soft, moderate tenderness in epigastrium and right upper quadrant.  EXTREMITIES:  No cyanosis, clubbing, or edema.  NEUROLOGIC:  No focal motor, sensory, or cerebellar deficits.   IMPRESSION:  Cholelithiasis with cholecystitis.   PLAN:  Laparoscopic cholecystectomy.      Dirk Dress. Katrinka Blazing, M.D.  Electronically Signed     LCS/MEDQ  D:  10/19/2005  T:  10/19/2005  Job:  [id number]

## 2011-09-16 LAB — RH IMMUNE GLOBULIN WORKUP (NOT WOMEN'S HOSP): ABO/RH(D): O NEG

## 2011-09-16 LAB — CBC
HCT: 40.6
Hemoglobin: 13.7
MCHC: 33.8
MCV: 92.6
Platelets: 251
RBC: 4.38
RDW: 13.1
WBC: 9.8

## 2015-10-01 ENCOUNTER — Emergency Department (HOSPITAL_BASED_OUTPATIENT_CLINIC_OR_DEPARTMENT_OTHER): Payer: Self-pay

## 2015-10-01 ENCOUNTER — Encounter (HOSPITAL_BASED_OUTPATIENT_CLINIC_OR_DEPARTMENT_OTHER): Payer: Self-pay | Admitting: *Deleted

## 2015-10-01 ENCOUNTER — Emergency Department (HOSPITAL_BASED_OUTPATIENT_CLINIC_OR_DEPARTMENT_OTHER)
Admission: EM | Admit: 2015-10-01 | Discharge: 2015-10-01 | Disposition: A | Discharge status: Home / Self Care | Payer: Self-pay | Attending: Emergency Medicine | Admitting: Emergency Medicine

## 2015-10-01 DIAGNOSIS — Z720 Tobacco use: Secondary | ICD-10-CM | POA: Insufficient documentation

## 2015-10-01 DIAGNOSIS — N644 Mastodynia: Secondary | ICD-10-CM | POA: Insufficient documentation

## 2015-10-01 DIAGNOSIS — R079 Chest pain, unspecified: Secondary | ICD-10-CM | POA: Insufficient documentation

## 2015-10-01 MED ORDER — KETOROLAC TROMETHAMINE 60 MG/2ML IM SOLN
60.0000 mg | Freq: Once | INTRAMUSCULAR | Status: AC
  Administered 2015-10-01: 60 mg via INTRAMUSCULAR
  Filled 2015-10-01: qty 2

## 2015-10-01 MED ORDER — IBUPROFEN 800 MG PO TABS: 800.0000 mg | ORAL_TABLET | Freq: Three times a day (TID) | ORAL | Status: AC

## 2015-10-01 NOTE — ED Provider Notes (Signed)
CSN: [id number]     Arrival date & time 10/01/15  1947 History  By signing my name below, I, Gwenyth Oberatherine Macek, attest that this documentation has been prepared under the direction and in the presence of Marily MemosJason Keyleigh Manninen, MD.  Electronically Signed: Gwenyth Oberatherine Macek, ED Scribe. 10/01/2015. 8:42 PM.    Chief Complaint  Patient presents with  . Breast Pain   The history is provided by the patient. No language interpreter was used.    HPI Comments: Mckenzie [name] is a 29 y.o. female who presents to the Emergency Department complaining of of intermittent, moderate sharp left breast pain that began 1 week ago. She states radiation to her left arm that started today as an associated symptom. Patient states sleeping on her abdomen makes pain worse. Patient denies past history of PE/DVT and is not currently breastfeeding. She denies redness, swelling, nausea, vomiting, back pani, cough, fever, swelling under her axillas and SOB as associated symptoms.   History reviewed. No pertinent past medical history. Past Surgical History  Procedure Laterality Date  . Cholecystectomy    . Tubal ligation     No family history on file. Social History  Substance Use Topics  . Smoking status: Current Some Day Smoker  . Smokeless tobacco: None  . Alcohol Use: No   OB History    No data available     Review of Systems  Constitutional: Negative for fever.  Respiratory: Negative for cough and shortness of breath.   Gastrointestinal: Negative for nausea and vomiting.  Musculoskeletal:       + Left breast pain  Skin: Negative for color change and rash.  All other systems reviewed and are negative.   Allergies  Review of patient's allergies indicates no known allergies.  Home Medications   Prior to Admission medications   Medication Sig Start Date End Date Taking? Authorizing Provider  ibuprofen (ADVIL,MOTRIN) 800 MG tablet Take 1 tablet (800 mg total) by mouth 3 (three) times daily. 10/01/15    Marily MemosJason Blakeleigh Domek, MD   BP 145/109 mmHg  Pulse 88  Temp(Src) 98.2 F (36.8 C) (Oral)  Resp 18  Ht 5\' 2"  (1.575 m)  Wt 230 lb (104.327 kg)  BMI 42.06 kg/m2  SpO2 100%  LMP 09/06/2015 Physical Exam  Constitutional: She appears well-developed and well-nourished. No distress.  HENT:  Head: Normocephalic and atraumatic.  Right Ear: External ear normal.  Left Ear: External ear normal.  Mouth/Throat: Oropharynx is clear and moist.  Eyes: Conjunctivae and EOM are normal. Pupils are equal, round, and reactive to light.  Neck: Normal range of motion. Neck supple. No tracheal deviation present.  Cardiovascular: Normal rate.   Pulmonary/Chest: Effort normal. No respiratory distress.  Point tenderness to the left upper chest, superior and lateral to the breast No masses, erythema, fluctuance, induration or warmth Separate, healing ecchymosis on left breast No axillary lymph nodes  Musculoskeletal: Normal range of motion. She exhibits tenderness (Left upper chest). She exhibits no edema.  No pain with ROM of UE  Neurological: No cranial nerve deficit.  Skin: Skin is warm and dry.  Psychiatric: She has a normal mood and affect. Her behavior is normal.  Nursing note and vitals reviewed.   ED Course  Procedures  DIAGNOSTIC STUDIES: Oxygen Saturation is 100% on RA, normal by my interpretation.    COORDINATION OF CARE: 8:41 PM Discussed treatment plan including a chest x-ray with pt at bedside and pt agreed to plan.  Labs Review Labs Reviewed - No data to  display  Imaging Review Dg Chest 2 View  10/01/2015  CLINICAL DATA:  Acute onset of left-sided breast pain for 1 week. Initial encounter. EXAM: CHEST  2 VIEW COMPARISON:  None. FINDINGS: The lungs are well-aerated and clear. There is no evidence of focal opacification, pleural effusion or pneumothorax. The heart is normal in size; the mediastinal contour is within normal limits. No acute osseous abnormalities are seen. Clips are noted  within the right upper quadrant, reflecting prior cholecystectomy. IMPRESSION: No acute cardiopulmonary process seen. Electronically Signed   By: Roanna Raider M.D.   On: 10/01/2015 21:43     EKG Interpretation None      MDM   Final diagnoses:  Breast pain    Patient with pain over left upper lateral breast without any evidence of infection, mass, DVT. Tender to palpation question possible muscular pain and will treat accordingly. PERC negative, doubt PE. Patient will follow with primary doctor for any further workup.  I have personally and contemperaneously reviewed labs and imaging and used in my decision making as above.   A medical screening exam was performed and I feel the patient has had an appropriate workup for their chief complaint at this time and likelihood of emergent condition existing is low. They have been counseled on decision, discharge, follow up and which symptoms necessitate immediate return to the emergency department. They or their family verbally stated understanding and agreement with plan and discharged in stable condition.   I personally performed the services described in this documentation, which was scribed in my presence. The recorded information has been reviewed and is accurate.     Marily Memos, MD 10/01/15 [phone]

## 2015-10-01 NOTE — ED Notes (Signed)
Pain in her left breast for a week. Pain goes into her shoulder. Sharp stabbing pain that comes and goes.

## 2015-10-24 ENCOUNTER — Emergency Department (HOSPITAL_BASED_OUTPATIENT_CLINIC_OR_DEPARTMENT_OTHER)
Admission: EM | Admit: 2015-10-24 | Discharge: 2015-10-24 | Disposition: A | Discharge status: Home / Self Care | Payer: Self-pay | Attending: Emergency Medicine | Admitting: Emergency Medicine

## 2015-10-24 ENCOUNTER — Encounter (HOSPITAL_BASED_OUTPATIENT_CLINIC_OR_DEPARTMENT_OTHER): Payer: Self-pay | Admitting: *Deleted

## 2015-10-24 DIAGNOSIS — R197 Diarrhea, unspecified: Secondary | ICD-10-CM | POA: Insufficient documentation

## 2015-10-24 DIAGNOSIS — N760 Acute vaginitis: Secondary | ICD-10-CM | POA: Insufficient documentation

## 2015-10-24 DIAGNOSIS — R42 Dizziness and giddiness: Secondary | ICD-10-CM | POA: Insufficient documentation

## 2015-10-24 DIAGNOSIS — Z791 Long term (current) use of non-steroidal anti-inflammatories (NSAID): Secondary | ICD-10-CM | POA: Insufficient documentation

## 2015-10-24 DIAGNOSIS — Z3202 Encounter for pregnancy test, result negative: Secondary | ICD-10-CM | POA: Insufficient documentation

## 2015-10-24 DIAGNOSIS — F17200 Nicotine dependence, unspecified, uncomplicated: Secondary | ICD-10-CM | POA: Insufficient documentation

## 2015-10-24 DIAGNOSIS — B9689 Other specified bacterial agents as the cause of diseases classified elsewhere: Secondary | ICD-10-CM

## 2015-10-24 DIAGNOSIS — Z711 Person with feared health complaint in whom no diagnosis is made: Secondary | ICD-10-CM

## 2015-10-24 DIAGNOSIS — Z202 Contact with and (suspected) exposure to infections with a predominantly sexual mode of transmission: Secondary | ICD-10-CM | POA: Insufficient documentation

## 2015-10-24 LAB — URINALYSIS, ROUTINE W REFLEX MICROSCOPIC
BILIRUBIN URINE: NEGATIVE
Glucose, UA: NEGATIVE mg/dL
KETONES UR: NEGATIVE mg/dL
Leukocytes, UA: NEGATIVE
Nitrite: NEGATIVE
PH: 6 (ref 5.0–8.0)
PROTEIN: NEGATIVE mg/dL
Specific Gravity, Urine: 1.025 (ref 1.005–1.030)

## 2015-10-24 LAB — CBC WITH DIFFERENTIAL/PLATELET
BASOS ABS: 0 10*3/uL (ref 0.0–0.1)
Basophils Relative: 0 %
Eosinophils Absolute: 0 10*3/uL (ref 0.0–0.7)
Eosinophils Relative: 1 %
HEMATOCRIT: 44.3 % (ref 36.0–46.0)
Hemoglobin: 14.2 g/dL (ref 12.0–15.0)
LYMPHS ABS: 2.5 10*3/uL (ref 0.7–4.0)
LYMPHS PCT: 29 %
MCH: 29.8 pg (ref 26.0–34.0)
MCHC: 32.1 g/dL (ref 30.0–36.0)
MCV: 92.9 fL (ref 78.0–100.0)
MONO ABS: 1.2 10*3/uL — AB (ref 0.1–1.0)
Monocytes Relative: 13 %
NEUTROS ABS: 5 10*3/uL (ref 1.7–7.7)
Neutrophils Relative %: 57 %
Platelets: 233 10*3/uL (ref 150–400)
RBC: 4.77 MIL/uL (ref 3.87–5.11)
RDW: 14.1 % (ref 11.5–15.5)
WBC: 8.8 10*3/uL (ref 4.0–10.5)

## 2015-10-24 LAB — URINE MICROSCOPIC-ADD ON

## 2015-10-24 LAB — WET PREP, GENITAL
Sperm: NONE SEEN
Trich, Wet Prep: NONE SEEN
YEAST WET PREP: NONE SEEN

## 2015-10-24 LAB — COMPREHENSIVE METABOLIC PANEL
ALT: 33 U/L (ref 14–54)
AST: 42 U/L — AB (ref 15–41)
Albumin: 4.4 g/dL (ref 3.5–5.0)
Alkaline Phosphatase: 107 U/L (ref 38–126)
Anion gap: 7 (ref 5–15)
BILIRUBIN TOTAL: 0.5 mg/dL (ref 0.3–1.2)
BUN: 11 mg/dL (ref 6–20)
CALCIUM: 9.4 mg/dL (ref 8.9–10.3)
CO2: 28 mmol/L (ref 22–32)
CREATININE: 0.87 mg/dL (ref 0.44–1.00)
Chloride: 105 mmol/L (ref 101–111)
Glucose, Bld: 97 mg/dL (ref 65–99)
Potassium: 3.8 mmol/L (ref 3.5–5.1)
Sodium: 140 mmol/L (ref 135–145)
TOTAL PROTEIN: 7.6 g/dL (ref 6.5–8.1)

## 2015-10-24 LAB — PREGNANCY, URINE: Preg Test, Ur: NEGATIVE

## 2015-10-24 MED ORDER — ONDANSETRON HCL 4 MG PO TABS: 4.0000 mg | ORAL_TABLET | Freq: Four times a day (QID) | ORAL | Status: AC

## 2015-10-24 MED ORDER — METRONIDAZOLE 500 MG PO TABS
500.0000 mg | ORAL_TABLET | Freq: Once | ORAL | Status: AC
  Administered 2015-10-24: 500 mg via ORAL
  Filled 2015-10-24: qty 1

## 2015-10-24 MED ORDER — ONDANSETRON 4 MG PO TBDP
4.0000 mg | ORAL_TABLET | Freq: Once | ORAL | Status: AC
  Administered 2015-10-24: 4 mg via ORAL
  Filled 2015-10-24: qty 1

## 2015-10-24 MED ORDER — METRONIDAZOLE 500 MG PO TABS: 500.0000 mg | ORAL_TABLET | Freq: Two times a day (BID) | ORAL | Status: AC

## 2015-10-24 NOTE — ED Notes (Addendum)
Nausea and dizziness for 2 days. Dysuria. States while she is here she would like to get checked for an STD. No symptoms of STD and no known exposure. States she is just curious.

## 2015-10-24 NOTE — ED Provider Notes (Signed)
CSN: [id number]     Arrival date & time 10/24/15  1818 History   First MD Initiated Contact with Patient 10/24/15 1827     Chief Complaint  Patient presents with  . Nausea   (Consider location/radiation/quality/duration/timing/severity/associated sxs/prior Treatment) The history is provided by the patient. No language interpreter was used.   Mckenzie [name] is a 29 y.o female with a history of tubal ligation and cholecystectomy who presents for nausea, vomiting (2-3/day), diarrhea, dizziness, pelvic pain, and dysuria for the past 3 days. She states she would also like to be checked for STDs because she has a new partner. She denies using protection on occasion. She states that it itches when she uses condoms. She also states she is nervous and scared. She denies any fever, chills, constipation, hematemesis, hematochezia, vaginal bleeding, vaginal discharge, hematuria, or urinary frequency. Her last menstrual period was at the beginning and end of last month.   History reviewed. No pertinent past medical history. Past Surgical History  Procedure Laterality Date  . Cholecystectomy    . Tubal ligation     No family history on file. Social History  Substance Use Topics  . Smoking status: Current Some Day Smoker  . Smokeless tobacco: None  . Alcohol Use: No   OB History    No data available     Review of Systems  Constitutional: Negative for fever and chills.  Respiratory: Negative for shortness of breath.   Gastrointestinal: Positive for nausea, vomiting, abdominal pain and diarrhea. Negative for constipation and blood in stool.  Genitourinary: Positive for dysuria.  All other systems reviewed and are negative.     Allergies  Review of patient's allergies indicates no known allergies.  Home Medications   Prior to Admission medications   Medication Sig Start Date End Date Taking? Authorizing Provider  ibuprofen (ADVIL,MOTRIN) 800 MG tablet Take 1 tablet (800 mg total) by mouth  3 (three) times daily. 10/01/15   Marily MemosJason Mesner, MD  metroNIDAZOLE (FLAGYL) 500 MG tablet Take 1 tablet (500 mg total) by mouth 2 (two) times daily. 10/24/15   Ceola Para Patel-Mills, PA-C  ondansetron (ZOFRAN) 4 MG tablet Take 1 tablet (4 mg total) by mouth every 6 (six) hours. 10/24/15   Lynesha Bango Patel-Mills, PA-C   BP 127/99 mmHg  Pulse 61  Temp(Src) 97.6 F (36.4 C) (Oral)  Resp 20  Ht 5\' 2"  (1.575 m)  Wt 239 lb (108.41 kg)  BMI 43.70 kg/m2  SpO2 99%  LMP 09/23/2015 Physical Exam  Constitutional: She is oriented to person, place, and time. She appears well-developed and well-nourished.  HENT:  Head: Normocephalic and atraumatic.  Eyes: Conjunctivae are normal.  Neck: Normal range of motion. Neck supple.  Cardiovascular: Normal rate, regular rhythm and normal heart sounds.   Pulmonary/Chest: Effort normal and breath sounds normal. No respiratory distress. She has no wheezes.  Abdominal: Soft. She exhibits no distension. There is no tenderness. There is no rebound, no guarding and no CVA tenderness.    No reproducible abdominal tenderness to palpation. No guarding or rebound. Abdomen is obese and soft. No abdominal distention. No CVA tenderness.  She reports pain in the suprapubic region as diagrammed.  Musculoskeletal: Normal range of motion.  Neurological: She is alert and oriented to person, place, and time.  Skin: Skin is warm and dry.  Nursing note and vitals reviewed.   ED Course  Procedures (including critical care time) Labs Review Labs Reviewed  WET PREP, GENITAL - Abnormal; Notable for the following:  Clue Cells Wet Prep HPF POC PRESENT (*)    WBC, Wet Prep HPF POC FEW (*)    All other components within normal limits  URINALYSIS, ROUTINE W REFLEX MICROSCOPIC (NOT AT [facility]) - Abnormal; Notable for the following:    APPearance CLOUDY (*)    Hgb urine dipstick TRACE (*)    All other components within normal limits  URINE MICROSCOPIC-ADD ON - Abnormal; Notable for the  following:    Squamous Epithelial / LPF 6-30 (*)    Bacteria, UA FEW (*)    All other components within normal limits  CBC WITH DIFFERENTIAL/PLATELET - Abnormal; Notable for the following:    Monocytes Absolute 1.2 (*)    All other components within normal limits  COMPREHENSIVE METABOLIC PANEL - Abnormal; Notable for the following:    AST 42 (*)    All other components within normal limits  URINE CULTURE  PREGNANCY, URINE  RPR  HIV ANTIBODY (ROUTINE TESTING)  GC/CHLAMYDIA PROBE AMP ([place]) NOT AT [facility]    Imaging Review No results found. I have personally reviewed and evaluated these lab results as part of my medical decision-making.   EKG Interpretation None      MDM   Final diagnoses:  Concern about STD in female without diagnosis  Bacterial vaginosis   Patient presents for abdominal pain, nausea, vomiting, diarrhea, and dysuria. She is also requesting to be checked for STDs. She is well-appearing and afebrile. She has clue cells on wet prep but otherwise her labs, including her CBC and CMP are unremarkable. She has no UTI. She is not pregnant. RPR, HIV, GC, and Chlamydia are pending. I do not suspect appendicitis or colitis.  I discussed with the patient that her STD results would not come back for a few days and that [facility] would call her if the results were positive. I prescribed Flagyl since she had clue cells on wet prep and complained of burning vaginal pain. I explained not drinking alcohol with this medication. I also prescribed Zofran for nausea. She was given follow-up and I discussed return precautions. Patient verbally agrees with the plan. Medications  ondansetron (ZOFRAN-ODT) disintegrating tablet 4 mg (4 mg Oral Given 10/24/15 1944)  metroNIDAZOLE (FLAGYL) tablet 500 mg (500 mg Oral Given 10/24/15 2021)   Filed Vitals:   10/24/15 1823  BP: 127/99  Pulse: 61  Temp: 97.6 F (36.4 C)  Resp: [address], PA-C 10/24/15 2138  Lyndal Pulley, MD 10/24/15 2234

## 2015-10-24 NOTE — Discharge Instructions (Signed)
Bacterial Vaginosis Follow-up with a primary care provider using the resource guide below. Do not drink alcohol while using metronidazole. Bacterial vaginosis is an infection of the vagina. It happens when too many germs (bacteria) grow in the vagina. Having this infection puts you at risk for getting other infections from sex. Treating this infection can help lower your risk for other infections, such as:   Chlamydia.  Gonorrhea.  HIV.  Herpes. HOME CARE  Take your medicine as told by your doctor.  Finish your medicine even if you start to feel better.  Tell your sex partner that you have an infection. They should see their doctor for treatment.  During treatment:  Avoid sex or use condoms correctly.  Do not douche.  Do not drink alcohol unless your doctor tells you it is ok.  Do not breastfeed unless your doctor tells you it is ok. GET HELP IF:  You are not getting better after 3 days of treatment.  You have more grey fluid (discharge) coming from your vagina than before.  You have more pain than before.  You have a fever. MAKE SURE YOU:   Understand these instructions.  Will watch your condition.  Will get help right away if you are not doing well or get worse.   This information is not intended to replace advice given to you by your health care provider. Make sure you discuss any questions you have with your health care provider.   Document Released: 09/01/2008 Document Revised: 12/14/2014 Document Reviewed: 07/05/2013 Elsevier Interactive Patient Education 2016 [employer]   Emergency Department Resource Guide 1) Find a Doctor and Pay Out of Pocket Although you won't have to find out who is covered by your insurance plan, it is a good idea to ask around and get recommendations. You will then need to call the office and see if the doctor you have chosen will accept you as a new patient and what types of options they offer for patients who are self-pay.  Some doctors offer discounts or will set up payment plans for their patients who do not have insurance, but you will need to ask so you aren't surprised when you get to your appointment.  2) Contact Your Local Health Department Not all health departments have doctors that can see patients for sick visits, but many do, so it is worth a call to see if yours does. If you don't know where your local health department is, you can check in your phone book. The CDC also has a tool to help you locate your state's health department, and many state websites also have listings of all of their local health departments.  3) Find a Walk-in Clinic If your illness is not likely to be very severe or complicated, you may want to try a walk in clinic. These are popping up all over the country in pharmacies, drugstores, and shopping centers. They're usually staffed by nurse practitioners or physician assistants that have been trained to treat common illnesses and complaints. They're usually fairly quick and inexpensive. However, if you have serious medical issues or chronic medical problems, these are probably not your best option.  No Primary Care Doctor: - Call Health Connect at  [phone] - they can help you locate a primary care doctor that  accepts your insurance, provides certain services, etc. - Physician Referral Service- [phone]  Chronic Pain Problems: Organization         Address  Phone   Notes  [facility]  [phone] Patients need to be referred by their primary care doctor.   Medication Assistance: Organization         Address  Phone   Notes  [facility] Medication [facility] [address] [place], [place] [address] [phone] --Must be a resident of [facility] -- Must have NO insurance coverage whatsoever (no Medicaid/ Medicare, etc.) -- The pt. MUST have a primary care doctor that directs their care regularly and follows them in the  community   Med[facility]ist  [phone]   [employer]  [phone]    Agencies that provide inexpensive medical care: Organization         Address  Phone   Notes  [facility]  [phone]   [facility]    [phone]   [facility] [address] [place], [place] [address] [phone]   [facility] [address] [place], [place] [phone]   Planned Parenthood    [phone]   [facility]    [phone]   [facility]  [address], [place] Phone:  [phone], Fax:  [phone] Hours of Operation:  9 am - 6 pm, M-F.  Also accepts Medicaid/Medicare and self-pay.  [facility]  [address], [place] Phone: [phone], Fax: [phone]. Hours of Operation:  8:30 am - 5:30 pm, M-F.  Also accepts Medicaid and self-pay.  [facility] [place] [address], [place] Phone: [phone]   [facility] [address] Natasha Bence [place], [place] [phone], Ext. [phone] Mondays & Thursdays: 7-9 AM.  First 15 patients are seen on a first come, first serve basis.    Medicaid-accepting [facility] Providers:  Organization         Address  Phone   Notes  [facility] [address], [place] [phone] Also accepts self-pay patients.  [facility] [address] Laurell Josephs [place], [place]  [phone]   [facility] [address], [place] [phone]   [facility] [address], [place] [phone]   Renaye Rakers [address], [place]   [phone] Only accepts [place] patients after they have their name applied to their card.   Self-Pay (no insurance) in [facility]:  Organization         Address  Phone   Notes  Sickle Cell Patients, [facility]  Internal Medicine [address] [place], [place] [phone]   [facility] [address] [place], [place] [phone]   [facility] [place]  [address], [place] [phone]   [facility]/Dr. Osei-Bonsu  [address], [place] or [address], [place] [phone] Phone number for both [place] and [place] locations is the same.  [facility] [address], [place] [phone]   [facility] [address], [place] or [address] [phone]   [facility] [address], [place] [phone], phone; [phone], fax Sees patients 1st and 3rd Saturday of every month.  Must not qualify for public or private insurance (i.e. Medicaid, Medicare, [place] Health  Choice, Veterans' Benefits)  Household income should be no more than 200% of the poverty level The clinic cannot treat you if you are pregnant or think you are pregnant  Sexually transmitted diseases are not treated at the clinic.    Dental Care: Organization         Address  Phone  Notes  [facility] Department of [facility] [address] [place], [place] [phone] Accepts children up to age 27 who are enrolled in [place] or [place] Health Choice; pregnant women with a Medicaid card; and children who have applied for Medicaid or [place] Health Choice, but were declined, whose parents can pay a reduced fee at time of service.  [facility] Department of [facility]  [address], [place] [phone] Accepts children up to age 90 who are enrolled in [place] or [place] Health Choice; pregnant women with a Medicaid card; and children who have applied for Medicaid or [place] Health Choice, but were declined, whose parents can pay a reduced fee at time of service.  [facility]  [address] [place], [place] [phone] Patients are seen by appointment only. Walk-ins are not accepted. [facility] will see patients 52 years of age and older. Monday - Tuesday (8am-5pm) Most Wednesdays (8:30-5pm) $30 per visit, cash only  [facility] Adult [facility] Access [facility]  [address], [facility] [phone] Patients are seen by appointment only. Walk-ins are not accepted. [facility] will see patients 68 years of age and older. One Wednesday Evening (Monthly: Volunteer Based).  $30 per visit, cash only  [employer]  [phone] for adults; Children under age 81, call Graduate Pediatric Dentistry at [phone]. Children aged 21-14, please call [phone] to request a pediatric application.  Dental services are provided in all areas of dental care including fillings, crowns and bridges, complete and partial dentures, implants, gum treatment, root canals, and extractions. Preventive care is also provided. Treatment is provided to both adults and children. Patients are selected via a lottery and there is often a waiting list.   [facility] [address] [place]  [phone] [email or web address]   [facility] [address] [place], [place] [phone], Ext. [phone] Second and Fourth Thursday of each month, opens at 6:30 AM; Clinic ends at 9 AM.  Patients are seen on a first-come first-served basis, and a limited number are seen during each clinic.   [facility]  [address] Ether [place], [place] [phone]   Eligibility Requirements You must have lived in [place], [place], or [place] counties for at least the last three months.   You cannot be eligible for state or federal sponsored [place], including [employer], [place], or [employer].   You generally cannot be eligible for healthcare insurance through your employer.    How to apply: Eligibility screenings are held every  Tuesday and Wednesday afternoon from 1:00 pm until 4:00 pm. You do not need an appointment for the interview!  [facility] [address] [place], [place] [phone]   [facility] Health [facility]  [phone]   [facility] Health Department  [phone]   [facility] Health Department  [phone]    Behavioral Health Resources in the Community: Intensive Outpatient Programs Organization         Address  Phone  Notes  [facility] Services [address], [place], [place] [phone]   [facility] [address], [place], [place] [phone]   [facility] [address], [place], [place]  [phone]   [facility] [address],  [place], [place] [phone] or [phone]   Substance Abuse Resources Organization         Address  Phone  Notes  [facility]  [phone]   [facility]  [phone]   [facility]  [phone]   [facility]  [phone]   Residential & Outpatient Substance Abuse Program  [phone]   Psychological Services Organization         Address  Phone  Notes  [facility]  [phone]   [facility]  [phone]   [facility] [address], [place] [phone] or [phone]    Mobile Crisis Teams Organization         Address  Phone  Notes  [facility], Mobile Crisis Care Unit  [phone]   [facility]  [address] [place], [place] [phone]   Doristine LocksSharon DeEsch [address] [place] [phone]    Self-Help/Support Groups Organization         Address  Phone             Notes  [facility] [place] - variety of support groups  336- [phone] Call for more information  Narcotics Anonymous (NA), [facility] [address], [place]  2 meetings at this location    Statisticianesidential Treatment Programs Organization         Address  Phone  Notes  [facility] [address],    [place]  [phone]   [facility]  [address], [place] [phone], [place], [place] [phone]   [facility] [address], [place] [phone] Admissions: 8am-3pm M-F  [facility] [address]    [place], [place] [phone]   [facility] [address], [place], [place] [phone]   [facility] [address]  [place], [place] [phone]   [facility] - Intensive Outpatient [address], [place], [place] [phone]   [facility] ([facility] [address]  [place], [place] [phone] or [phone]   [facility] ([facility]) [address] [place], [place] [phone] Accepts Medicaid  [facility] [address]  [place] [phone] Substance Abuse/Addiction Treatment   [facility] Resources Organization         Address  Phone  Notes  [facility]  [phone]) [phone]   Angie FavaJulie Brannon, PhD [address] [place], [place]   [phone]) [phone] or [phone]) [phone]   [facility]   [address] [place], [place] [phone]) [phone]   [facility] [address], [place], [place] [phone] Insurance/Medicaid/sponsorship through [facility]  [facility] [address]                                    [place], [place] [phone] Therapy/tele-psych/case  [facility] [address]   [place], [place] [phone](336) [phone]    Dr. Lolly MustacheArfeen  [phone]   [facility]  [employer] [facility] 1) [address], [address] [place]  2) [address] [place] 3)  [address], [place] [phone]  [phone]   [facility] Child Abuse Hotline (269)270-4014 or [phone] (After  Hours)

## 2015-10-26 LAB — URINE CULTURE: Special Requests: NORMAL

## 2015-10-26 LAB — RPR: RPR: NONREACTIVE

## 2015-10-26 LAB — HIV ANTIBODY (ROUTINE TESTING W REFLEX): HIV SCREEN 4TH GENERATION: NONREACTIVE

## 2015-10-28 LAB — GC/CHLAMYDIA PROBE AMP (~~LOC~~) NOT AT ARMC
CHLAMYDIA, DNA PROBE: NEGATIVE
Neisseria Gonorrhea: NEGATIVE

## 2017-06-21 IMAGING — CR DG CHEST 2V
2 series · 2 of 2 positions shown · non-contrast
Comparison: None.

CLINICAL DATA: Acute onset of left-sided breast pain for 1 week.
Initial encounter.

EXAM:
CHEST  2 VIEW

[w chest pa]
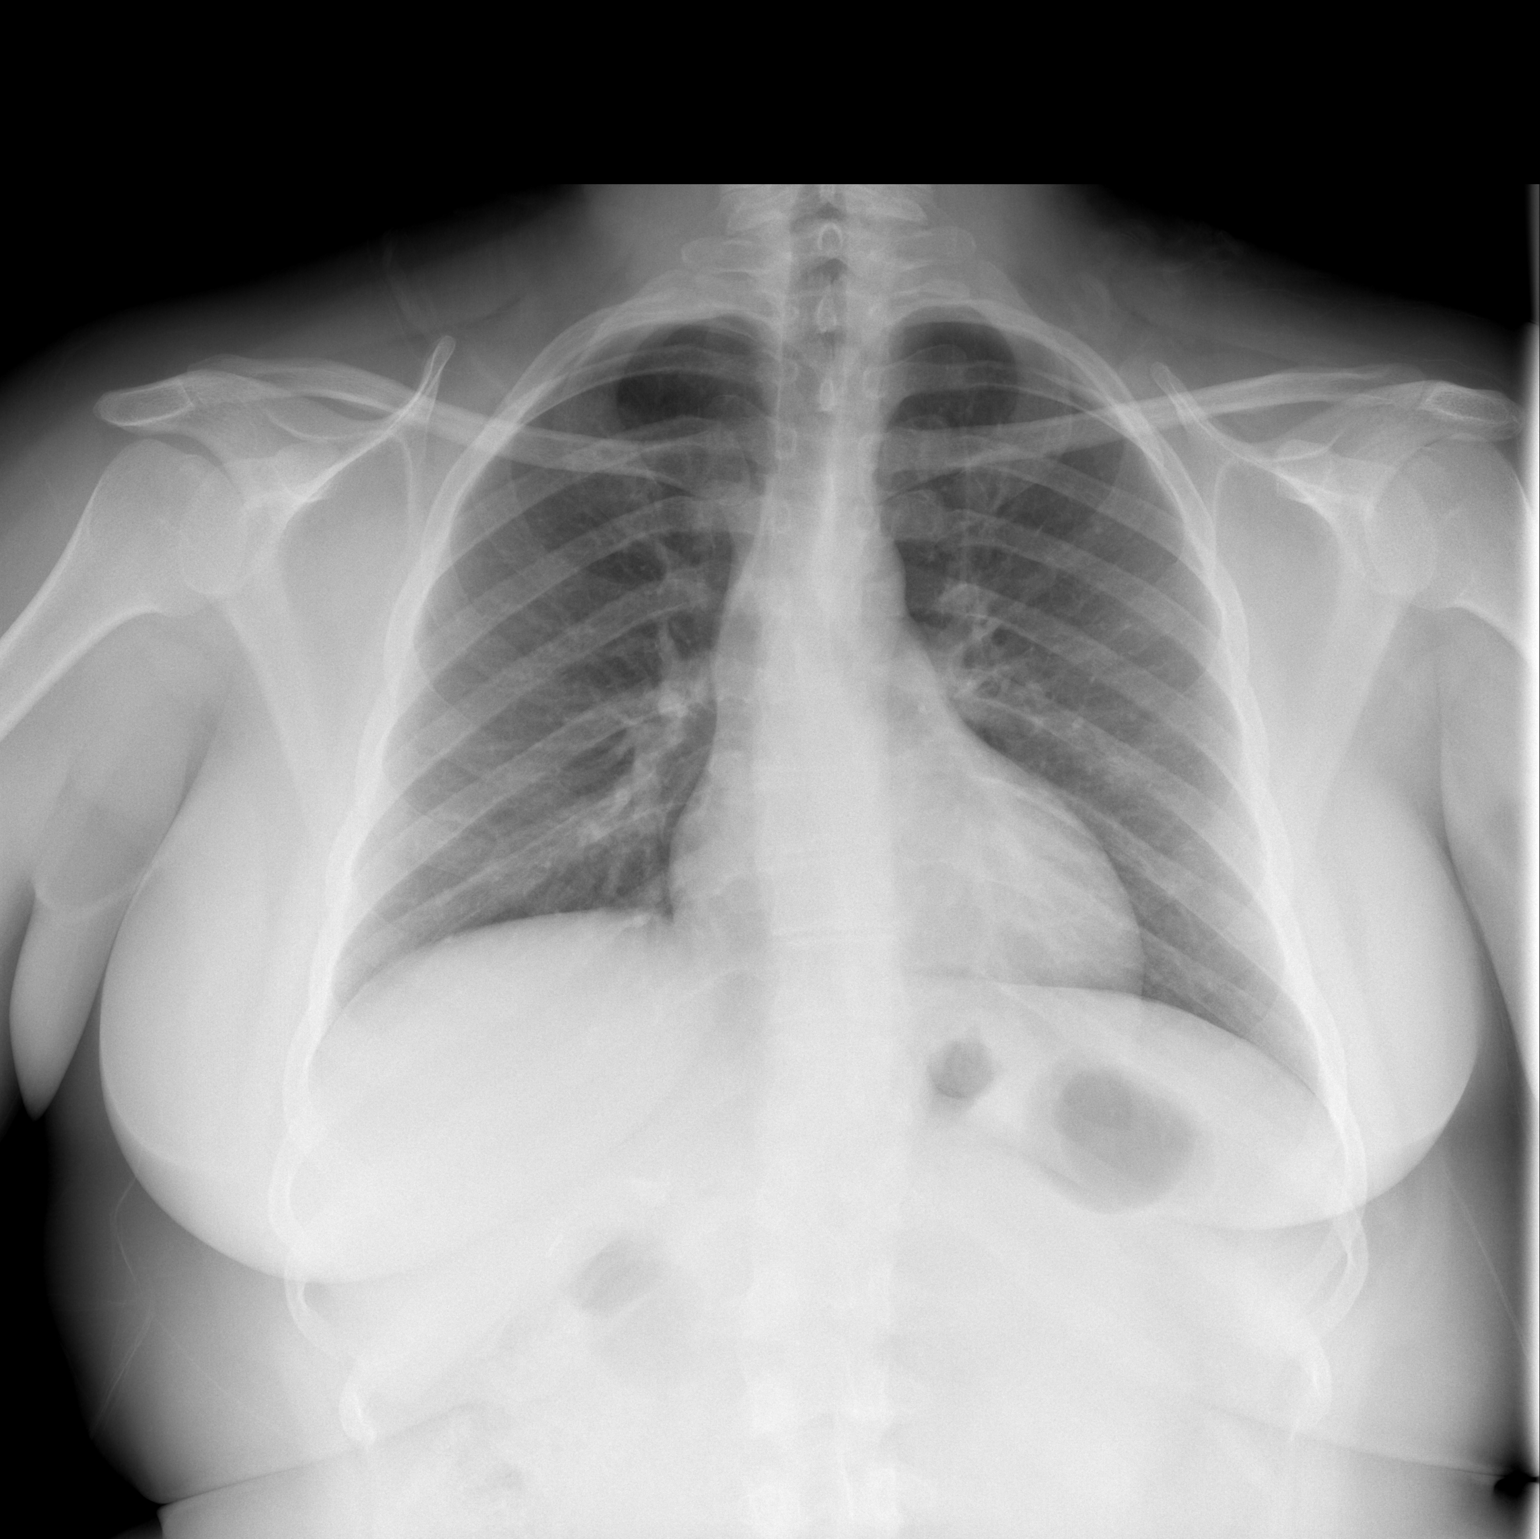

[w chest lat]
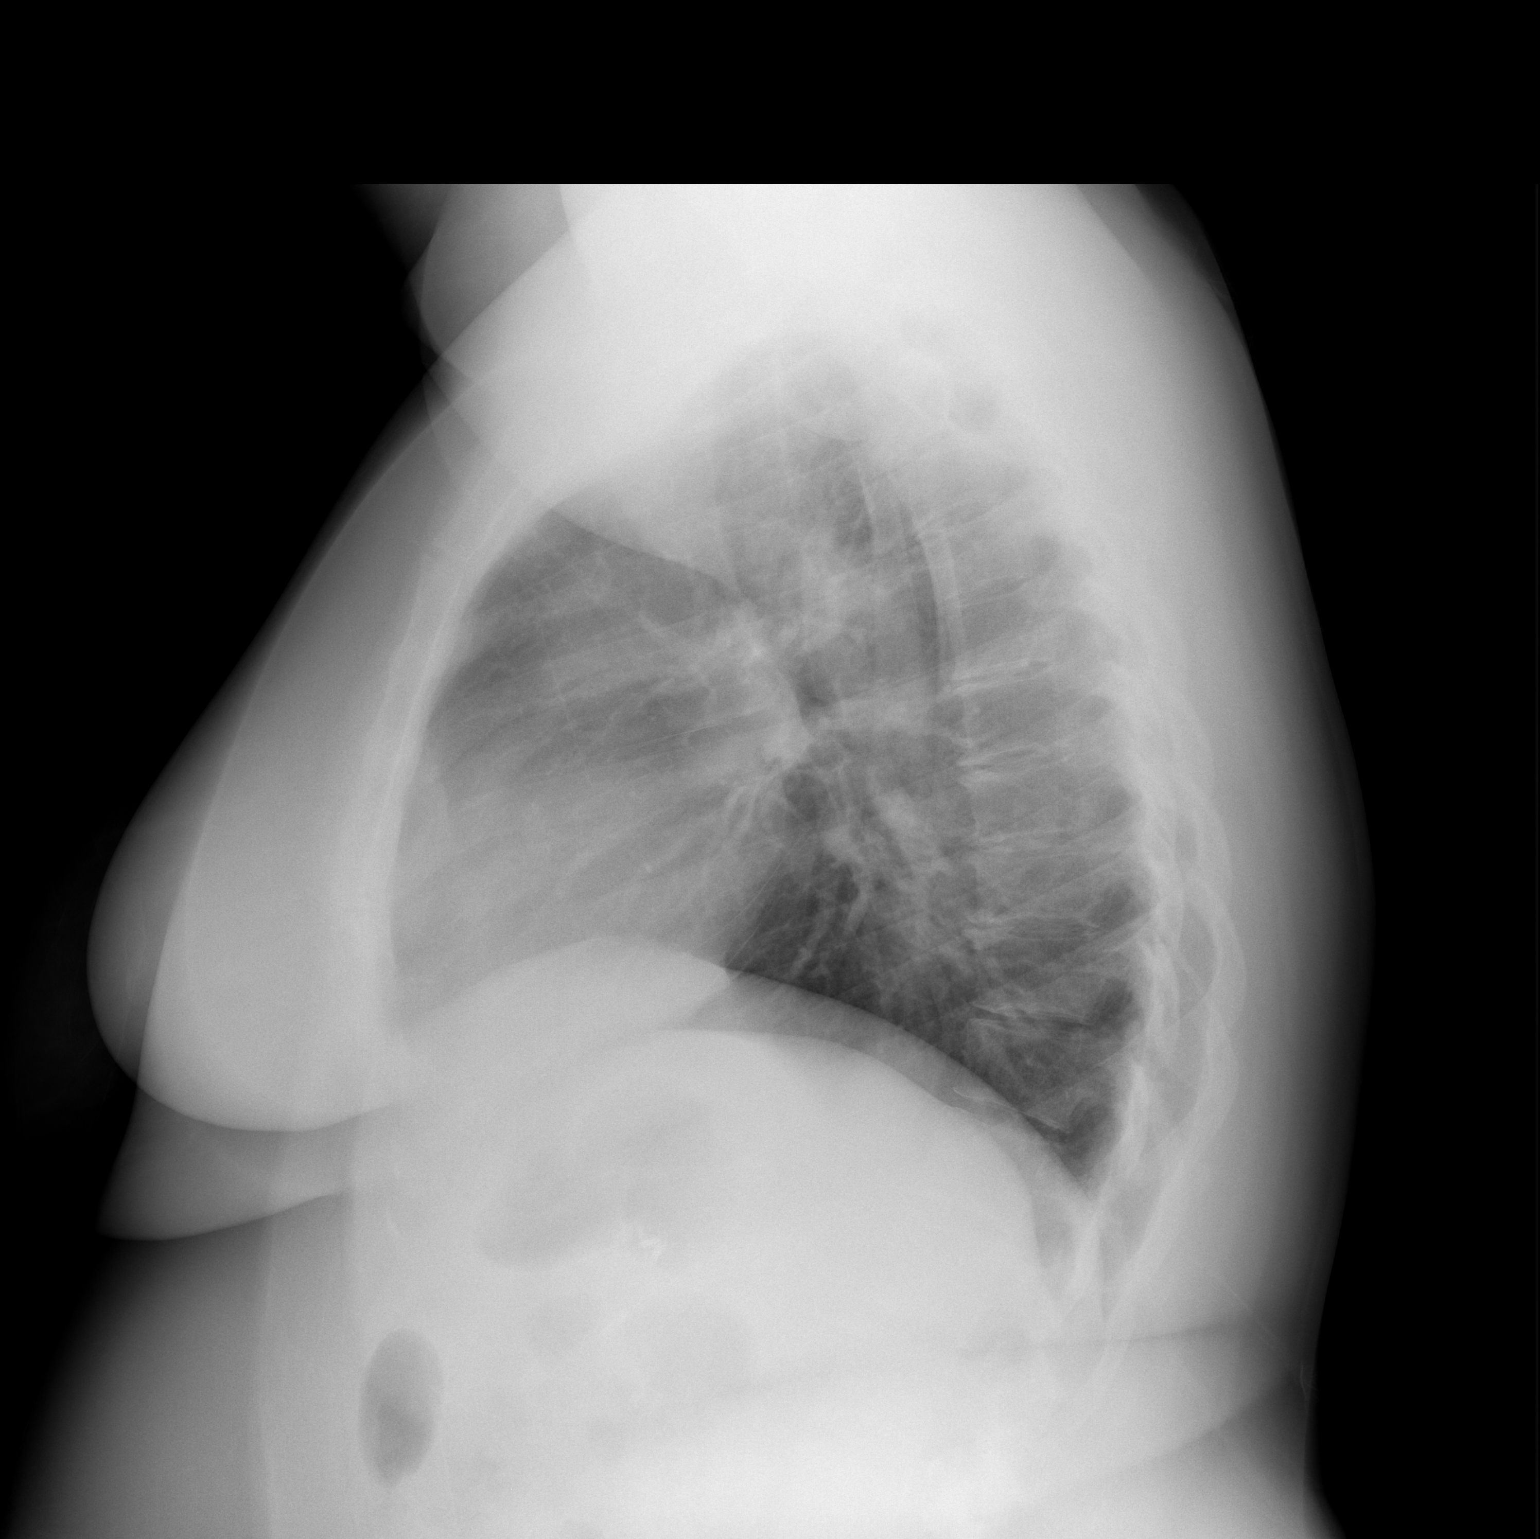

[2 of 2 positions shown; findings below may reference images not displayed]

FINDINGS: The lungs are well-aerated and clear. There is no evidence of focal
opacification, pleural effusion or pneumothorax.

The heart is normal in size; the mediastinal contour is within
normal limits. No acute osseous abnormalities are seen. Clips are
noted within the right upper quadrant, reflecting prior
cholecystectomy.
IMPRESSION: No acute cardiopulmonary process seen.

## 2018-11-17 ENCOUNTER — Encounter: Payer: Medicaid Other | Attending: Family Medicine | Admitting: Nutrition

## 2018-11-17 ENCOUNTER — Encounter: Payer: Self-pay | Admitting: Nutrition

## 2018-11-17 VITALS — Ht 63.0 in | Wt 262.0 lb

## 2018-11-17 DIAGNOSIS — E782 Mixed hyperlipidemia: Secondary | ICD-10-CM | POA: Insufficient documentation

## 2018-11-17 DIAGNOSIS — E669 Obesity, unspecified: Secondary | ICD-10-CM | POA: Insufficient documentation

## 2018-11-17 NOTE — Patient Instructions (Signed)
Goals 1. Follow MY Plate 2. Cut out junk food and sodsa 3. Increase fresh fruits and vegetables. 4. Exercise 30 minutes a day Lose 1-2 lbs per week

## 2018-11-17 NOTE — Progress Notes (Signed)
  Medical Nutrition Therapy:  Appt start time: 0930 end time:  1030.   Assessment:  Primary concerns today: Obesity. Lives with her boyfriend and 3 kids. She works Education officer, environmentalcleaning houses. Eats 2 meals per day. She does the cooking and shopping in the home.  Just had a tooth pulled and stopped sodas.. Has numerous fillings from cavities. Wants to lose weight. She has cut out all sodas due to decay teeth and wanting to lose weight. Motivated and engaged to make changes with diet and exercise to lose weight.  At risk for DM Type 2 due to obesity, family history. Current diet is excessive in calories from fat, salt and sugar. Needs more fresh fruits, vegetables and whole grains. Labs from PCP: 11/19  TG elevated 250 mg/dl, Glu 84 mg/dl,  No Z6XA1C  Preferred Learning Style:   Auditory  Visual  Hands on  Learning Readiness:  Ready  Change in progress   MEDICATIONS:   DIETARY INTAKE  24-hr recall:  B ( AM): skips  Snk ( AM):   L ( PM): 2 pm; vegetable souip, 4 rolls, water Snk ( PM):  D ( PM): Pizza frozen 1 slice, sweet tea Snk ( PM):  Beverages: sweet tea, water  Usual physical activity: ADL  Estimated energy needs: 1200  calories 135 g carbohydrates 90 g protein 33 g fat  Progress Towards Goal(s):  Some progress.   Nutritional Diagnosis:  NB-1.1 Food and nutrition-related knowledge deficit As related to  Obesity and Hyperlipidemia As evidenced by BMi > 40 and TG 250 mg/dl..    Intervention:  Nutrition and Diabetes education provided on My Plate, CHO counting, meal planning, portion sizes, timing of meals, avoiding snacks between meals unless having a low blood sugar, target ranges for A1C and blood sugars, signs/symptoms and treatment of hyper/hypoglycemia, monitoring blood sugars, taking medications as prescribed, benefits of exercising 30 minutes per day and prevention of complications of DM.  Goals 1. Follow MY Plate 2. Cut out junk food and sodas 3. Increase fresh  fruits and vegetables. 4. Exercise 30 minutes a day Lose 1-2 lbs per week   Teaching Method Utilized: Visual Auditory Hands on  Handouts given during visit include:  The Plate Method    Meal Plan Card  Diabetes Instructions   Barriers to learning/adherence to lifestyle change: none  Demonstrated degree of understanding via:  Teach Back   Monitoring/Evaluation:  Dietary intake, exercise, meal planning , and body weight in 1 month(s).

## 2018-12-28 ENCOUNTER — Telehealth: Payer: Self-pay | Admitting: Nutrition

## 2018-12-28 ENCOUNTER — Encounter: Payer: Medicaid Other | Admitting: Nutrition

## 2018-12-28 NOTE — Telephone Encounter (Signed)
Left Message with sister to call and reschedule missed appt.
